# Patient Record
Sex: Female | Born: 1935 | Race: Black or African American | Hispanic: No | Smoking: Never smoker
Health system: Southern US, Community
[De-identification: ages and names within clinical notes are randomized; demographics above are authoritative.]

## PROBLEM LIST (undated history)

## (undated) DIAGNOSIS — K219 Gastro-esophageal reflux disease without esophagitis: Secondary | ICD-10-CM

## (undated) DIAGNOSIS — F0390 Unspecified dementia without behavioral disturbance: Secondary | ICD-10-CM

## (undated) DIAGNOSIS — I10 Essential (primary) hypertension: Secondary | ICD-10-CM

## (undated) DIAGNOSIS — M549 Dorsalgia, unspecified: Secondary | ICD-10-CM

## (undated) HISTORY — PX: REPLACEMENT TOTAL KNEE: SUR1224

## (undated) HISTORY — PX: BREAST CYST EXCISION: SHX579

## (undated) HISTORY — PX: ABDOMINAL HYSTERECTOMY: SHX81

---

## 2004-10-08 ENCOUNTER — Other Ambulatory Visit: Payer: Self-pay

## 2004-10-25 ENCOUNTER — Inpatient Hospital Stay: Payer: Self-pay | Admitting: Specialist

## 2005-06-18 ENCOUNTER — Ambulatory Visit: Payer: Self-pay | Admitting: Unknown Physician Specialty

## 2005-09-17 ENCOUNTER — Inpatient Hospital Stay: Payer: Self-pay | Admitting: Specialist

## 2006-02-05 ENCOUNTER — Ambulatory Visit: Payer: Self-pay | Admitting: Unknown Physician Specialty

## 2006-07-31 ENCOUNTER — Ambulatory Visit: Payer: Self-pay | Admitting: Unknown Physician Specialty

## 2006-09-12 ENCOUNTER — Ambulatory Visit: Payer: Self-pay | Admitting: Unknown Physician Specialty

## 2007-08-17 ENCOUNTER — Ambulatory Visit: Payer: Self-pay | Admitting: Unknown Physician Specialty

## 2008-05-12 IMAGING — CR DG KNEE 1-2V*R*
1 series · 2 of 2 positions shown · non-contrast
Comparison: none

REASON FOR EXAM: Post-op
COMMENTS:  Bedside (portable):Y

[Series 1: view not recorded · 0.17mm/px · 2 of 2 slices shown]
[im 1/2]
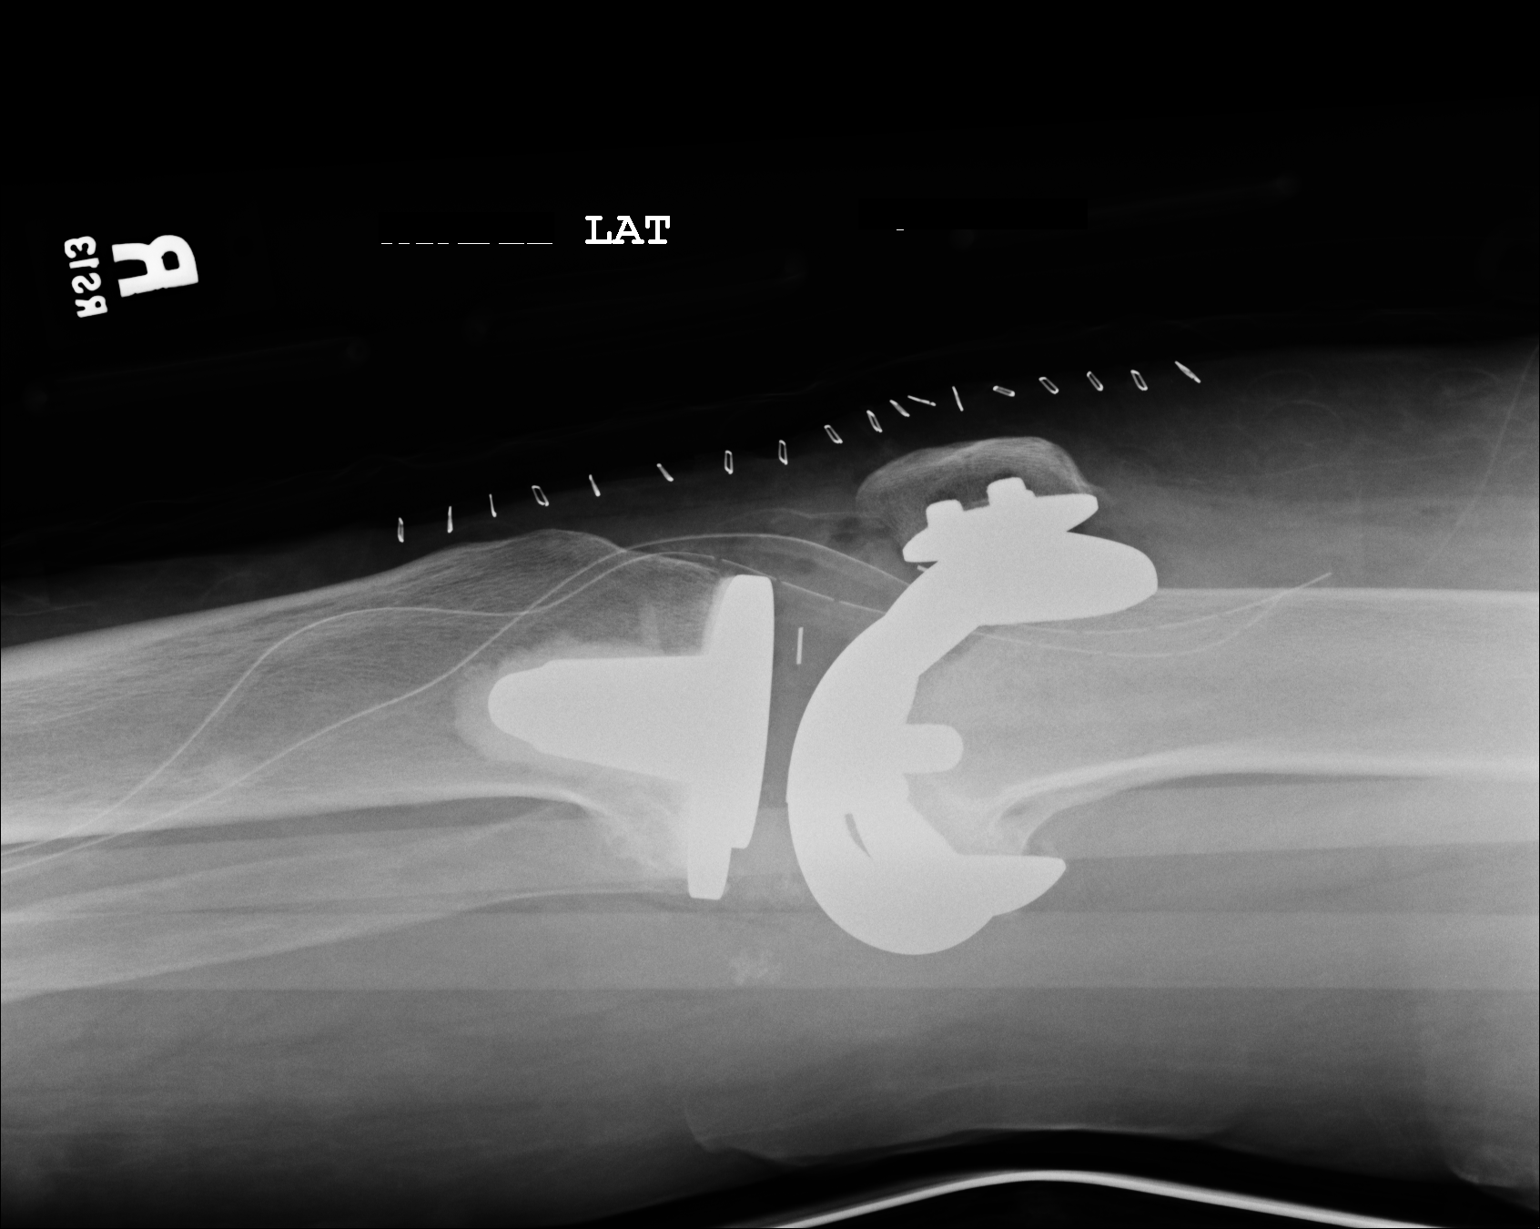
[im 2/2]
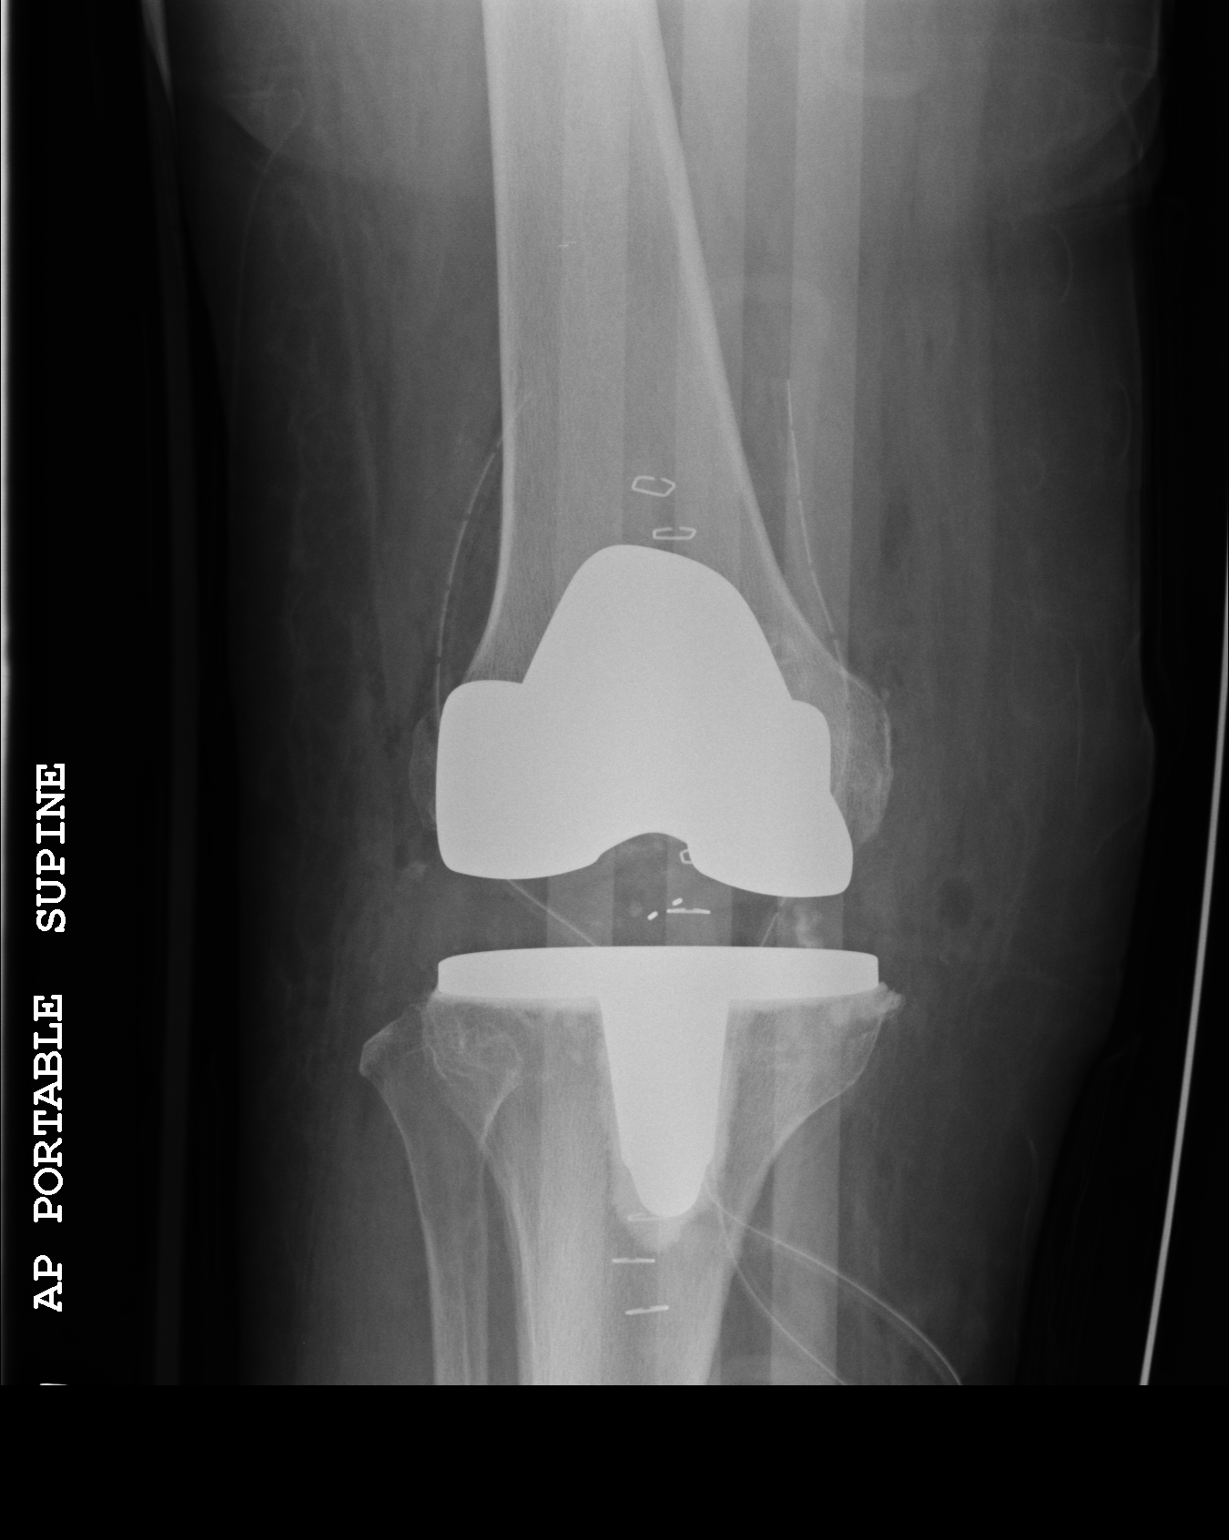

[2 of 2 positions shown; findings below may reference images not displayed]

PROCEDURE:     DXR - DXR KNEE RIGHT AP AND LATERAL  - September 17, 2005 [DATE]

RESULT:     Portable views of the RIGHT knee reveal the patient to have
undergone total knee prosthesis placement. Radiographic positioning of the
prosthetic components is good. Orthopedic drain lines and skin staples are
present.
IMPRESSION: The patient has undergone RIGHT total knee prosthesis
placement.

Further interpretation is deferred to Dr. Okletey.

## 2008-12-06 ENCOUNTER — Ambulatory Visit: Payer: Self-pay | Admitting: Unknown Physician Specialty

## 2009-12-07 ENCOUNTER — Ambulatory Visit: Payer: Self-pay | Admitting: Unknown Physician Specialty

## 2010-12-10 ENCOUNTER — Ambulatory Visit: Payer: Self-pay | Admitting: Unknown Physician Specialty

## 2011-12-18 ENCOUNTER — Ambulatory Visit: Payer: Self-pay | Admitting: Unknown Physician Specialty

## 2013-03-31 ENCOUNTER — Ambulatory Visit: Payer: Self-pay | Admitting: Internal Medicine

## 2015-01-27 ENCOUNTER — Encounter: Payer: Self-pay | Admitting: Emergency Medicine

## 2015-01-27 ENCOUNTER — Inpatient Hospital Stay
Admission: EM | Admit: 2015-01-27 | Discharge: 2015-02-04 | DRG: 064 | Disposition: E | Payer: Medicare Other | Attending: Internal Medicine | Admitting: Internal Medicine

## 2015-01-27 ENCOUNTER — Emergency Department: Payer: Medicare Other

## 2015-01-27 DIAGNOSIS — Z720 Tobacco use: Secondary | ICD-10-CM

## 2015-01-27 DIAGNOSIS — F17210 Nicotine dependence, cigarettes, uncomplicated: Secondary | ICD-10-CM

## 2015-01-27 DIAGNOSIS — R4020 Unspecified coma: Secondary | ICD-10-CM | POA: Diagnosis present

## 2015-01-27 DIAGNOSIS — I10 Essential (primary) hypertension: Secondary | ICD-10-CM | POA: Diagnosis present

## 2015-01-27 DIAGNOSIS — Z8249 Family history of ischemic heart disease and other diseases of the circulatory system: Secondary | ICD-10-CM | POA: Diagnosis not present

## 2015-01-27 DIAGNOSIS — R001 Bradycardia, unspecified: Secondary | ICD-10-CM | POA: Diagnosis present

## 2015-01-27 DIAGNOSIS — Z515 Encounter for palliative care: Secondary | ICD-10-CM

## 2015-01-27 DIAGNOSIS — I609 Nontraumatic subarachnoid hemorrhage, unspecified: Principal | ICD-10-CM | POA: Diagnosis present

## 2015-01-27 DIAGNOSIS — F0390 Unspecified dementia without behavioral disturbance: Secondary | ICD-10-CM | POA: Diagnosis present

## 2015-01-27 DIAGNOSIS — Z96653 Presence of artificial knee joint, bilateral: Secondary | ICD-10-CM | POA: Diagnosis present

## 2015-01-27 DIAGNOSIS — Z66 Do not resuscitate: Secondary | ICD-10-CM | POA: Diagnosis present

## 2015-01-27 DIAGNOSIS — M109 Gout, unspecified: Secondary | ICD-10-CM | POA: Diagnosis present

## 2015-01-27 DIAGNOSIS — I619 Nontraumatic intracerebral hemorrhage, unspecified: Secondary | ICD-10-CM | POA: Diagnosis not present

## 2015-01-27 DIAGNOSIS — G919 Hydrocephalus, unspecified: Secondary | ICD-10-CM | POA: Diagnosis present

## 2015-01-27 DIAGNOSIS — I615 Nontraumatic intracerebral hemorrhage, intraventricular: Secondary | ICD-10-CM | POA: Diagnosis present

## 2015-01-27 DIAGNOSIS — I447 Left bundle-branch block, unspecified: Secondary | ICD-10-CM | POA: Diagnosis present

## 2015-01-27 DIAGNOSIS — J9600 Acute respiratory failure, unspecified whether with hypoxia or hypercapnia: Secondary | ICD-10-CM | POA: Diagnosis present

## 2015-01-27 DIAGNOSIS — E876 Hypokalemia: Secondary | ICD-10-CM | POA: Diagnosis present

## 2015-01-27 DIAGNOSIS — G936 Cerebral edema: Secondary | ICD-10-CM | POA: Diagnosis present

## 2015-01-27 DIAGNOSIS — K219 Gastro-esophageal reflux disease without esophagitis: Secondary | ICD-10-CM

## 2015-01-27 DIAGNOSIS — I719 Aortic aneurysm of unspecified site, without rupture: Secondary | ICD-10-CM | POA: Diagnosis present

## 2015-01-27 DIAGNOSIS — R40243 Glasgow coma scale score 3-8: Secondary | ICD-10-CM | POA: Diagnosis not present

## 2015-01-27 DIAGNOSIS — I629 Nontraumatic intracranial hemorrhage, unspecified: Secondary | ICD-10-CM

## 2015-01-27 HISTORY — DX: Dorsalgia, unspecified: M54.9

## 2015-01-27 HISTORY — DX: Unspecified dementia, unspecified severity, without behavioral disturbance, psychotic disturbance, mood disturbance, and anxiety: F03.90

## 2015-01-27 HISTORY — DX: Gastro-esophageal reflux disease without esophagitis: K21.9

## 2015-01-27 HISTORY — DX: Essential (primary) hypertension: I10

## 2015-01-27 LAB — URINALYSIS COMPLETE WITH MICROSCOPIC (ARMC ONLY)
BACTERIA UA: NONE SEEN
Bilirubin Urine: NEGATIVE
Glucose, UA: 50 mg/dL — AB
HGB URINE DIPSTICK: NEGATIVE
Ketones, ur: NEGATIVE mg/dL
LEUKOCYTES UA: NEGATIVE
NITRITE: NEGATIVE
PH: 7 (ref 5.0–8.0)
PROTEIN: NEGATIVE mg/dL
SPECIFIC GRAVITY, URINE: 1.013 (ref 1.005–1.030)

## 2015-01-27 LAB — DIFFERENTIAL
BASOS ABS: 0 10*3/uL (ref 0–0.1)
BASOS PCT: 0 %
EOS ABS: 0.1 10*3/uL (ref 0–0.7)
Eosinophils Relative: 2 %
Lymphocytes Relative: 42 %
Lymphs Abs: 2.2 10*3/uL (ref 1.0–3.6)
MONOS PCT: 6 %
Monocytes Absolute: 0.3 10*3/uL (ref 0.2–0.9)
NEUTROS PCT: 50 %
Neutro Abs: 2.6 10*3/uL (ref 1.4–6.5)

## 2015-01-27 LAB — COMPREHENSIVE METABOLIC PANEL
ALT: 15 U/L (ref 14–54)
AST: 29 U/L (ref 15–41)
Albumin: 3.8 g/dL (ref 3.5–5.0)
Alkaline Phosphatase: 73 U/L (ref 38–126)
Anion gap: 8 (ref 5–15)
BUN: 14 mg/dL (ref 6–20)
CHLORIDE: 107 mmol/L (ref 101–111)
CO2: 28 mmol/L (ref 22–32)
Calcium: 9 mg/dL (ref 8.9–10.3)
Creatinine, Ser: 0.76 mg/dL (ref 0.44–1.00)
GFR calc Af Amer: >60 mL/min (ref >60)
Glucose, Bld: 204 mg/dL — ABNORMAL HIGH (ref 65–99)
POTASSIUM: 3.3 mmol/L — AB (ref 3.5–5.1)
SODIUM: 143 mmol/L (ref 135–145)
Total Bilirubin: 0.7 mg/dL (ref 0.3–1.2)
Total Protein: 6.5 g/dL (ref 6.5–8.1)

## 2015-01-27 LAB — APTT: APTT: 24 s (ref 24–36)

## 2015-01-27 LAB — CBC
HEMATOCRIT: 42.5 % (ref 35.0–47.0)
HEMOGLOBIN: 13.9 g/dL (ref 12.0–16.0)
MCH: 31.1 pg (ref 26.0–34.0)
MCHC: 32.7 g/dL (ref 32.0–36.0)
MCV: 95.2 fL (ref 80.0–100.0)
Platelets: 194 10*3/uL (ref 150–440)
RBC: 4.46 MIL/uL (ref 3.80–5.20)
RDW: 14.5 % (ref 11.5–14.5)
WBC: 5.2 10*3/uL (ref 3.6–11.0)

## 2015-01-27 LAB — PROTIME-INR
INR: 0.89
PROTHROMBIN TIME: 12.3 s (ref 11.4–15.0)

## 2015-01-27 LAB — MRSA PCR SCREENING: MRSA BY PCR: NEGATIVE

## 2015-01-27 LAB — TROPONIN I

## 2015-01-27 LAB — LACTIC ACID, PLASMA: LACTIC ACID, VENOUS: 2.2 mmol/L — AB (ref 0.5–2.0)

## 2015-01-27 LAB — ETHANOL: Alcohol, Ethyl (B): <5 mg/dL (ref <5)

## 2015-01-27 MED ORDER — GLYCOPYRROLATE 0.2 MG/ML IJ SOLN: 0.4000 mg | Freq: Four times a day (QID) | INTRAMUSCULAR | Status: DC | PRN

## 2015-01-27 MED ORDER — BISACODYL 10 MG RE SUPP: 10.0000 mg | Freq: Every day | RECTAL | Status: DC | PRN

## 2015-01-27 MED ORDER — PROPOFOL 1000 MG/100ML IV EMUL
INTRAVENOUS | Status: AC
  Administered 2015-01-27: 1000 mg
  Filled 2015-01-27: qty 100

## 2015-01-27 MED ORDER — ONDANSETRON HCL 4 MG/2ML IJ SOLN: 4.0000 mg | Freq: Four times a day (QID) | INTRAMUSCULAR | Status: DC | PRN

## 2015-01-27 MED ORDER — DEXAMETHASONE SODIUM PHOSPHATE 10 MG/ML IJ SOLN
10.0000 mg | Freq: Four times a day (QID) | INTRAMUSCULAR | Status: DC
  Administered 2015-01-27 – 2015-01-28 (×5): 10 mg via INTRAVENOUS
  Filled 2015-01-27 (×13): qty 1

## 2015-01-27 MED ORDER — HYDRALAZINE HCL 20 MG/ML IJ SOLN
10.0000 mg | Freq: Once | INTRAMUSCULAR | Status: AC
  Administered 2015-01-27: 10 mg via INTRAVENOUS
  Filled 2015-01-27: qty 1

## 2015-01-27 MED ORDER — NALOXONE HCL 1 MG/ML IJ SOLN
INTRAMUSCULAR | Status: AC
  Administered 2015-01-27: 2 mg
  Filled 2015-01-27: qty 2

## 2015-01-27 MED ORDER — SODIUM CHLORIDE 0.9 % IJ SOLN
3.0000 mL | Freq: Two times a day (BID) | INTRAMUSCULAR | Status: DC
  Administered 2015-01-28 – 2015-01-30 (×4): 3 mL via INTRAVENOUS

## 2015-01-27 MED ORDER — PROCHLORPERAZINE 25 MG RE SUPP
25.0000 mg | Freq: Two times a day (BID) | RECTAL | Status: DC | PRN
  Filled 2015-01-27: qty 1

## 2015-01-27 MED ORDER — MORPHINE SULFATE (PF) 2 MG/ML IV SOLN
2.0000 mg | Freq: Once | INTRAVENOUS | Status: AC
  Administered 2015-01-27: 2 mg via INTRAVENOUS
  Filled 2015-01-27: qty 1

## 2015-01-27 MED ORDER — ACETAMINOPHEN 650 MG RE SUPP: 650.0000 mg | Freq: Four times a day (QID) | RECTAL | Status: DC | PRN

## 2015-01-27 MED ORDER — HYDRALAZINE HCL 20 MG/ML IJ SOLN: 5.0000 mg | INTRAMUSCULAR | Status: DC | PRN

## 2015-01-27 MED ORDER — LORAZEPAM 2 MG/ML IJ SOLN: 0.5000 mg | INTRAMUSCULAR | Status: DC | PRN

## 2015-01-27 MED ORDER — PANTOPRAZOLE SODIUM 40 MG PO PACK: 40.0000 mg | PACK | Freq: Every day | ORAL | Status: DC

## 2015-01-27 MED ORDER — MORPHINE SULFATE (PF) 2 MG/ML IV SOLN: 2.0000 mg | INTRAVENOUS | Status: DC | PRN

## 2015-01-27 NOTE — H&P (Signed)
[facility] [place] [facility]   PATIENT NAME: Abigail Rasmussen    MR#:  [id number]  DATE OF BIRTH:  05/02/1916  DATE OF ADMISSION:  01/22/2015  PRIMARY CARE PHYSICIAN: Leotis Shames  REQUESTING/REFERRING PHYSICIAN: Minna Antis  CHIEF COMPLAINT:   Chief Complaint  Patient presents with  . Code Stroke    HISTORY OF PRESENT ILLNESS:  Evella Kasal  is a 81 y.o. female with a known history of gastroesophageal reflux disease, hypertension, disc bulge. As per family, the patient got dressed this morning, took a shower and went outside for a while and came back in. The patient was hollering and family came in. The patient complained that her head and I was hurting. She slumped over. When EMS arrived, they thought she was going to vomit but she didn't. They couldn't get her to respond. Patient was brought in for further evaluation. Patient was intubated and is unresponsive. Patient was found to have a massive head bleed. Family declined transfer to a center with a neurosurgeon. They made the patient a DO NOT RESUSCITATE.  PAST MEDICAL HISTORY:   Past Medical History  Diagnosis Date  . Hypertension   . Dementia   . GERD (gastroesophageal reflux disease)   . Back pain     PAST SURGICAL HISTORY:   Past Surgical History  Procedure Laterality Date  . Abdominal hysterectomy    . Breast cyst excision    . Replacement total knee Bilateral     SOCIAL HISTORY:   Social History  Substance Use Topics  . Smoking status: Never Smoker   . Smokeless tobacco: Current User  . Alcohol Use: No    FAMILY HISTORY:   Family History  Problem Relation Age of Onset  . CAD Mother     DRUG ALLERGIES:   Allergies  Allergen Reactions  . Penicillins     REVIEW OF SYSTEMS:  Unable to obtain secondary to altered mental status   MEDICATIONS AT HOME:      prescription writer not updated.  Patient takes aspirin 325 mg by mouth daily, Carafate 1 g 4 times  a day, metoprolol ER 50 mg daily, tramadol 50 mg when necessary omeprazole 20 mg twice a day, Lasix 20 mg by mouth daily, allopurinol 100 mg by mouth daily Namenda 5 mg.  VITAL SIGNS:  Blood pressure 186/88, pulse 59, temperature 96.5 F (35.8 C), temperature source Rectal, resp. rate 21, weight 77.111 kg (170 lb), SpO2 100 %.  PHYSICAL EXAMINATION:  GENERAL:  81 y.o.-year-old patient lying in the bed, unresponsive on ventilator.Marland Kitchen  EYES: Pupils  Pinpoint. Unable to test extraocular muscles. HEENT:  Unable to look in mouth. Nasopharynx no erythema NECK:  Supple, no jugular venous distention. No thyroid enlargement, no tenderness.  LUNGS: decreased  breath sounds bilaterally, slight expiratory  wheezing,  norales,rhonchi or crepitation. No use of accessory muscles of respiration.  CARDIOVASCULAR: S1, S2 normal.  2/6 systolicmurmur,  norubs, or gallops.  ABDOMEN: Soft, nontender, nondistended. Bowel sounds present. No organomegaly or mass.  EXTREMITIES: No pedal edema, cyanosis, or clubbing.  NEUROLOGIC:  Patient unresponsive to sternal rub PSYCHIATRIC:  Unable to test secondary to altered mental status SKIN: No rash, lesion, or ulcer.   LABORATORY PANEL:   CBC  Recent Labs Lab 01/28/2015 0943  WBC 5.2  HGB 13.9  HCT 42.5  PLT 194   ------------------------------------------------------------------------------------------------------------------  Chemistries   Recent Labs Lab 01/07/2015 0943  NA 143  K 3.3*  CL 107  CO2 28  GLUCOSE 204*  BUN 14  CREATININE 0.76  CALCIUM 9.0  AST 29  ALT 15  ALKPHOS 73  BILITOT 0.7   ------------------------------------------------------------------------------------------------------------------  Cardiac Enzymes  Recent Labs Lab 01/11/2015 0943  TROPONINI <0.03   ------------------------------------------------------------------------------------------------------------------  RADIOLOGY:  Ct Head Wo Contrast  01/08/2015    CLINICAL DATA:  Pt with normal morning per nurse and then went unresponsive at 8:55 am.  EXAM: CT HEAD WITHOUT CONTRAST  TECHNIQUE: Contiguous axial images were obtained from the base of the skull through the vertex without intravenous contrast.  COMPARISON:  Brain MRI, 03/31/2013.  FINDINGS: There is extensive acute intracranial hemorrhage. Subarachnoid hemorrhage surrounds the brainstem from the pons through the midbrain, filling the basilar cisterns. It extends into the suprasellar cistern and throughout the left sylvian fissure with lesser subarachnoid hemorrhage noted along the interhemispheric fissure. Hemorrhage extends into the sulci of the left temporal, posterior frontal and parietal lobes. There is also prominent intraventricular hemorrhage with hemorrhage mostly filling the left lateral ventricle, partly filling the right lateral ventricle, filling the third ventricle and mostly filling the fourth ventricle. There is a small amount of subdural hemorrhage along the anterior interhemispheric fissure and the tentorium.  The ventricles are enlarged, to a greater degree than they were on the prior brain MRI, and there is relative sulcal effacement. These findings are consistent with hydrocephalus. There is mild midline shift to the right of 4 mm.  There is hypoattenuation throughout the white matter of the left cerebral hemisphere with milder rope right-sided white matter hypoattenuation. Findings are consistent with a combination of vasogenic edema and underlying chronic microvascular ischemic change. No convincing cortical infarct.  The visualized sinuses and mastoid air cells are clear. No skull lesion.  IMPRESSION: 1. Extensive intracranial hemorrhage which is predominantly subarachnoid and intraventricular hemorrhage. 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 2. There is ventricular  enlargement consistent with hydrocephalus. 3. There is vasogenic edema, more on the left than the right. Mild midline shift to the right noted of 4 mm. 4. No evidence of an ischemic infarct. These results were called by telephone at the time of interpretation on 01/12/2015 at 10:19 am to Dr. Minna Antis , who verbally acknowledged these results.   Electronically Signed   By: Amie Portland M.D.   On: 01/28/2015 10:20     IMPRESSION AND PLAN:   1. Extensive intracranial hemorrhage predominantly subarachnoid. Patient also has vasogenic edema. Family did not want to transfer to a tertiary care center where there is a Midwife. Patient was made a DO NOT RESUSCITATE. Family will be coming in from out of town. Further decision making process can be delayed then. I will start Decadron for the edema. When necessary IV held rales in for accelerated hypertension. Overall prognosis is poor. I don't expect the patient to survive much longer. I will get a palliative care consultation. Patient intubated for acute respiratory failure and airway protection. 2. Accelerated hypertension- when necessary IV hydralazine 3. Gastroesophageal reflux disease without esophagitis 4. History of gout 5. History of dementia  All the records are reviewed and case discussed with ED provider. Management plans discussed with the family and they are in agreement.  CODE STATUS: DO NOT RESUSCITATE  TOTAL TIME TAKING CARE OF THIS PATIENT: .  Patient will be admitted to the CCU secondary to being on the ventilator.   Alford Highland M.D on 01/06/2015 at  11:46 AM  Between 7am to 6pm - Pager - [phone]  After 6pm call admission pager [phone]  [facility]  Office  [phone]  CC: Primary care physician; No primary care provider on file.

## 2015-01-27 NOTE — Progress Notes (Signed)
   01/18/2015 1358  Clinical Encounter Type  Visited With Family  Visit Type Initial  Referral From Chaplain  Consult/Referral To Chaplain  Spiritual Encounters  Spiritual Needs Emotional  Stress Factors  Family Stress Factors Major life changes  Chaplain received referral from on-call chaplain who had visited with the family in the ED. Introduced self and offered a compassionate presence, chairs and kleenex to the family as they visited with patient in room and in the waiting room. Chaplain Omri Bertran A. Yassmin Binegar Ext. [phone]

## 2015-01-27 NOTE — Progress Notes (Signed)
   01/21/2015 2200  Clinical Encounter Type  Visited With Family  Visit Type Follow-up;Spiritual support  Spiritual Encounters  Spiritual Needs Emotional;Grief support  Continuing to follow family through grief process with patient who is dying. Offered emotional and spiritual support.

## 2015-01-27 NOTE — Progress Notes (Signed)
eLink Physician-Brief Progress Note Patient Name: Abigail Rasmussen DOB: 1915/06/07 MRN: [id number]   Date of Service  2015-02-22  HPI/Events of Note  ICH, intubated  eICU Interventions  protonix for SUP     Intervention Category Intermediate Interventions: Best-practice therapies (e.g. DVT, beta blocker, etc.)  ALVA,RAKESH V. February 22, 2015, 8:10 PM

## 2015-01-27 NOTE — ED Provider Notes (Signed)
[facility] Emergency Department Provider Note  Time seen: 9:52 AM  I have reviewed the triage vital signs and the nursing notes.   HISTORY  Chief Complaint No chief complaint on file.    HPI Abigail Rasmussen is a 81 y.o. female with a currently unknown past medical history presents to the emergency department unresponsive. According to family, per EMS report, the patient was awake, had showered, was getting ready for the day when she acutely became unresponsive. Patient unresponsive to painful or verbal stimuli, EMS was called. EMS stated very shallow breathing, unresponsive on arrival. They gave 2 mg of Narcan IV and did notice some response as far as her respiratory rate improving. However they noted the patient remained unresponsive to painful and verbal stimuli. Patient is brought to the emergency department emergency tablet, upon arrival to the emergency department the patient remains unresponsive to painful and verbal stimuli. No family present currently for additional history. EMS did confirm with the family that the patient does not have any advanced directives.    No past medical history on file.  There are no active problems to display for this patient.   No past surgical history on file.  No current outpatient prescriptions on file.  Allergies Review of patient's allergies indicates not on file.  No family history on file.  Social History Social History  Substance Use Topics  . Smoking status: Not on file  . Smokeless tobacco: Not on file  . Alcohol Use: Not on file    Review of Systems Unable to obtain a review of systems is a patient is unresponsive.  ____________________________________________   PHYSICAL EXAM:  VITAL SIGNS: ED Triage Vitals  Enc Vitals Group     BP 02-04-2015 0945 155/71 mmHg     Pulse Rate 2015/02/04 0945 61     Resp 2015-02-04 0945 26     Temp --      Temp src --      SpO2 02-04-2015 0945 100 %     Weight  2015-02-04 0945 170 lb (77.111 kg)     Height --      Head Cir --      Peak Flow --      Pain Score --      Pain Loc --      Pain Edu? --      Excl. in GC? --     Constitutional: Unresponsive to painful and verbal stimuli. Sonorous breathing. Eyes: 1 mm equal bilaterally ENT   Head: Normocephalic and atraumatic. Cardiovascular: Normal rate, regular rhythm Respiratory: Sonorous breathing. No wheezes/rales/rhonchi. Breath sounds are equal bilaterally. Gastrointestinal: Soft abdomen. No distention. No reaction to palpation (unresponsive) Musculoskeletal: Atraumatic extremities. Neurologic:  Unresponsive to verbal and painful stimuli. No response to sternal rub. Skin:  Skin is warm, dry and intact.  Psychiatric: Unresponsive   ____________________________________________    EKG  EKG reviewed and interpreted by myself shows sinus bradycardia at 56 bpm, widened QRS, left axis deviation, most consistent with left bundle branch block.  ____________________________________________    RADIOLOGY  CT scan consistent with intracranial hemorrhage  ____________________________________________ PROCEDURES  Procedure(s) performed: Intubation, see procedure note(s).  Critical Care performed: Yes, see critical care note(s)    INTUBATION Performed by: Minna Antis  Required items: required blood products, implants, devices, and special equipment available Patient identity confirmed: provided demographic data and hospital-assigned identification number Time out: Immediately prior to procedure a "time out" was called to verify the correct patient, procedure, equipment,  support staff and site/side marked as required.  Indications: Unresponsiveness   Intubation method: 4.0 Glidescope Laryngoscopy   Preoxygenation: 100% oxygen BVM  Sedatives: 20 Etomidate Paralytic: 100 Succinylcholine  Tube Size: 7.0 cuffed  Post-procedure assessment: chest rise and ETCO2 monitor Breath  sounds: equal and absent over the epigastrium Tube secured with: ETT holder    Patient tolerated the procedure well with no immediate complications.   CRITICAL CARE Performed by: Minna Antis   Total critical care time: 60 minutes  Critical care time was exclusive of separately billable procedures and treating other patients.  Critical care was necessary to treat or prevent imminent or life-threatening deterioration.  Critical care was time spent personally by me on the following activities: development of treatment plan with patient and/or surrogate as well as nursing, discussions with consultants, evaluation of patient's response to treatment, examination of patient, obtaining history from patient or surrogate, ordering and performing treatments and interventions, ordering and review of laboratory studies, ordering and review of radiographic studies, pulse oximetry and re-evaluation of patient's condition.   ____________________________________________    INITIAL IMPRESSION / ASSESSMENT AND PLAN / ED COURSE  Pertinent labs & imaging results that were available during my care of the patient were reviewed by me and considered in my medical decision making (see chart for details).  Patient presents the emergency department unresponsive. Per EMS report the patient was alert, showered herself this morning, but around 8:30 AM became acutely unresponsive. 2 mg of Narcan given by EMS and they state some improvement in respiratory rate. 2 mg of Narcan given in the emergency department with no response. Patient remains unresponsive to painful and verbal stimuli. No response to painful sternal rub. Decision was made to intubate for airway protection. Patient intubated using etomidate and succinylcholine, first attempt, no complications. Patient placed on propofol sedation. Code stroke called as the patient had an acute change in mental status/unresponsiveness beginning at 8:30 AM (clear  onset). Patient will be taken emergently to CT scan. Currently awaiting family arrival to discuss further care/management.  CT scan is unfortunately consistent with massive intracranial hemorrhage. I discussed the findings with the family. The family is very understandable and reasonable. Given the patient's age and comorbidities as well as degree of hemorrhage they do not wish to proceed with transfer for further evaluation. He would like the patient to remain intubated until family could arrive, we will keep the patient on propofol, and pain medication as needed. They are agreeable to comfort care measures. Patient will be admitted to [facility] for comfort care measures. Patient's blood pressure does continue to rise is now 200 systolic, with a heart rate in the 40s, most consistent with Cushing's reaction. We will treat with hydralazine. Patient to be admitted to the intensive care unit under comfort care measures.  ____________________________________________   FINAL CLINICAL IMPRESSION(S) / ED DIAGNOSES  Unresponsiveness Intracranial hemorrhage  Minna Antis, MD 31-Jan-2015 1114

## 2015-01-27 NOTE — Consult Note (Signed)
Palliative Medicine Inpatient Consult Note   Name: Abigail Rasmussen Date: 01/16/2015 MRN: [id number]  DOB: [birth year]  Referring Physician: Alford Highland, MD  Palliative Care consult requested for this 81 y.o. female for goals of medical therapy in patient with an acute severe hemorrhagic stroke felt to be something pt cannot survive.    IMPRESSION: 1.  Acute extensive intracranial hemorrhage ---likely due to ruptured aneurysm ---not survivable ---not to be transferred out due to grim prognosis even with neurosurgical intervention 2. Descending aortic aneurysm 3. Essential HTN 4. Degenerative Disc Disease with back pain 5. GERD 6.  Dementia --she still was independent with ALL ADLS and cooked full meals at home!!  7.  Smokeless tobacco use 8.  Intubated and ventilated status due to protection of airway needs given catastrophic stroke 9.  Hypokalemia   TODAY'S DISCUSSIONS AND DECISIONS: 1.   Pt is DNR   2.   Pt has one daughter here and other family members. They seem to be accepting that pt is actively dying. Pts daughter, however, is looking for 'good signs' like breathing over the vent and visibly swallowing saliva.  She is saying she just wants her to 'be with her here a while longer'.    3.  Patient's other daughter is arriving on a plane from [place] tonight and should be here around 9:30 pm.  Either then (if I am still here) or tomorrow, we can talk about plans for extubation to full comfort / terminal care.    4.  I have eliminated some non-comfort and futile orders so that pt will get something closer to comfort care.  I mentioned to family that her medications and care should be 'mostly comfort' and they were in agreement.      REVIEW OF SYSTEMS:  Patient is not able to provide ROS due to being intubated and sedated  SPIRITUAL SUPPORT SYSTEM: Yes.  SOCIAL HISTORY:  reports that she has never smoked. She uses smokeless tobacco. She reports that she does not drink  alcohol or use illicit drugs. Lived at home with family.  Until this event, she walked completely independently --dressing herself as she has done every day etc. This am, she cooked a big pot of butter beans and made cornbread in the oven. She then made up her bed. Then she sat down and had this stroke.    LEGAL DOCUMENTS:  I have placed a [facility] form in the paper chart.  CODE STATUS: DNR  PAST MEDICAL HISTORY: Past Medical History  Diagnosis Date  . Hypertension   . Dementia   . GERD (gastroesophageal reflux disease)   . Back pain     PAST SURGICAL HISTORY:  Past Surgical History  Procedure Laterality Date  . Abdominal hysterectomy    . Breast cyst excision    . Replacement total knee Bilateral     ALLERGIES:  is allergic to penicillins.  MEDICATIONS:  Current Facility-Administered Medications  Medication Dose Route Frequency Provider Last Rate Last Dose  . dexamethasone (DECADRON) injection 10 mg  10 mg Intravenous 4 times per day Alford Highland, MD   10 mg at 01/14/2015 1206  . hydrALAZINE (APRESOLINE) injection 5 mg  5 mg Intravenous Q4H PRN Alford Highland, MD      . morphine 2 MG/ML injection 2 mg  2 mg Intravenous Q1H PRN Alford Highland, MD      . sodium chloride 0.9 % injection 3 mL  3 mL Intravenous Q12H Alford Highland, MD  Vital Signs: BP 150/70 mmHg  Pulse 59  Temp(Src) 96.3 F (35.7 C) (Axillary)  Resp 21  Wt 77.111 kg (170 lb)  SpO2 100% Filed Weights   2015/01/28 0945  Weight: 77.111 kg (170 lb)    There is no height on file to calculate BMI.  PERFORMANCE STATUS (ECOG) : 4 - Bedbound    PHYSICAL EXAM: Intubated and unresponsive Breathing over the vent a little bit for a total of 21 bpm No JVD or Tm Hrt rrr no mgr but skipped beats are heard Lungs vent sounds but no rales Abd soft and NT with nl BS Ext no cyanosis or mottling noted.    LABS: CBC:    Component Value Date/Time   WBC 5.2 01-28-2015 0943   HGB 13.9 2015-01-28 0943    HCT 42.5 01/28/2015 0943   PLT 194 28-Jan-2015 0943   MCV 95.2 01/28/15 0943   NEUTROABS 2.6 January 28, 2015 0943   LYMPHSABS 2.2 Jan 28, 2015 0943   MONOABS 0.3 01/28/2015 0943   EOSABS 0.1 01-28-15 0943   BASOSABS 0.0 January 28, 2015 0943   Comprehensive Metabolic Panel:    Component Value Date/Time   NA 143 01-28-15 0943   K 3.3* 01-28-2015 0943   CL 107 01/28/2015 0943   CO2 28 01-28-15 0943   BUN 14 2015-01-28 0943   CREATININE 0.76 01-28-15 0943   GLUCOSE 204* 01/28/15 0943   CALCIUM 9.0 01/28/2015 0943   AST 29 January 28, 2015 0943   ALT 15 01/28/2015 0943   ALKPHOS 73 01/28/2015 0943   BILITOT 0.7 01/28/15 0943   PROT 6.5 01/28/15 0943   ALBUMIN 3.8 Jan 28, 2015 0943   TESTS: CT head wo CM January 28, 2015: 1. Extensive intracranial hemorrhage which is predominantly subarachnoid and intraventricular hemorrhage. 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 2. There is ventricular enlargement consistent with hydrocephalus. 3. There is vasogenic edema, more on the left than the right. Mild midline shift to the right noted of 4 mm. 4. No evidence of an ischemic infarct. These results were called by telephone at the time of interpretation on Jan 28, 2015 at 10:19 am to Dr. Minna Antis , who verbally acknowledged these results.   CXR Jan 28, 2015: Endotracheal tube in grossly good position. Tortuosity and enlargement of descending thoracic aorta is noted concerning for possible thoracic aortic aneurysm. CTA of the chest is recommended for further evaluation.  --------------------------------------------------------------------------------------------------------------  More than 50% of the visit was spent in counseling/coordination of care: Yes  Time Spent:80  minutes

## 2015-01-27 NOTE — ED Notes (Signed)
Pt to ed via ems emergency traffic from home. Daughter reports pt was sitting in her chair and yelled out c/o back pain. Daughter states when she went to check on pt she was slumped over in the chair unresponsive. Ems reports the same on arrival to home. Ems gave  narcan with no response. Pt arrives to ed unresponsive with snoring respirations along with pin point pupils.

## 2015-01-27 NOTE — Progress Notes (Signed)
   2015-02-13 0945  Clinical Encounter Type  Visited With Family  Visit Type Initial  Referral From Nurse  Consult/Referral To Chaplain  Spiritual Encounters  Spiritual Needs Emotional;Grief support  Stress Factors  Family Stress Factors Major life changes  Provided emotional and spiritual support for family members when they initially arrived and upon news that patient might not make it.

## 2015-01-27 NOTE — ED Notes (Signed)
Pt family at bedside

## 2015-01-28 ENCOUNTER — Inpatient Hospital Stay: Payer: Medicare Other

## 2015-01-28 DIAGNOSIS — I619 Nontraumatic intracerebral hemorrhage, unspecified: Secondary | ICD-10-CM

## 2015-01-28 DIAGNOSIS — J9600 Acute respiratory failure, unspecified whether with hypoxia or hypercapnia: Secondary | ICD-10-CM

## 2015-01-28 MED ORDER — MORPHINE SULFATE (PF) 4 MG/ML IV SOLN
4.0000 mg | Freq: Once | INTRAVENOUS | Status: AC
Start: 2015-01-28 — End: 2015-01-28
  Administered 2015-01-28: 4 mg via INTRAVENOUS
  Filled 2015-01-28: qty 1

## 2015-01-28 MED ORDER — PROPOFOL 1000 MG/100ML IV EMUL: 5.0000 ug/kg/min | INTRAVENOUS | Status: DC

## 2015-01-28 MED ORDER — NITROGLYCERIN 2 % TD OINT
1.0000 [in_us] | TOPICAL_OINTMENT | Freq: Four times a day (QID) | TRANSDERMAL | Status: DC
  Administered 2015-01-28 – 2015-01-29 (×2): 1 [in_us] via TOPICAL
  Filled 2015-01-28 (×2): qty 1

## 2015-01-28 MED ORDER — ANTISEPTIC ORAL RINSE SOLUTION (CORINZ)
7.0000 mL | OROMUCOSAL | Status: DC
  Administered 2015-01-28 – 2015-01-29 (×6): 7 mL via OROMUCOSAL
  Filled 2015-01-28 (×17): qty 7

## 2015-01-28 MED ORDER — MORPHINE 100MG IN NS 100ML (1MG/ML) PREMIX INFUSION
2.0000 mg/h | INTRAVENOUS | Status: DC
  Administered 2015-01-28 – 2015-01-30 (×2): 2 mg/h via INTRAVENOUS
  Filled 2015-01-28 (×2): qty 100

## 2015-01-28 MED ORDER — CHLORHEXIDINE GLUCONATE 0.12% ORAL RINSE (MEDLINE KIT)
15.0000 mL | Freq: Two times a day (BID) | OROMUCOSAL | Status: DC
  Administered 2015-01-28 – 2015-01-29 (×2): 15 mL via OROMUCOSAL
  Filled 2015-01-28 (×4): qty 15

## 2015-01-28 NOTE — Progress Notes (Signed)
[facility] - [employer] at [facility]                                                                                                                                                                                            Patient Demographics   Abigail Rasmussen, is a 81 y.o. female, DOB - 1915/06/21, ZOX:[id number]  Admit date - 01/14/2015   Admitting Physician Alford Highland, MD  Outpatient Primary MD for the patient is No primary care provider on file.   LOS - 1  Subjective: Patient continues to be on the ventilator on a low-dose propofol, her daughter at bedside that arrived from out of town    Review of Systems:  Unable to provide on the ventilator   Vitals:   Filed Vitals:   01/28/15 0800 01/28/15 0900 01/28/15 1000 01/28/15 1100  BP: 159/95 170/102 162/99 151/101  Pulse: 94 104 96 103  Temp:    99.6 F (37.6 C)  TempSrc:    Oral  Resp: Weight:      SpO2: 100% 100% 100% 100%    Wt Readings from Last 3 Encounters:  01/20/2015 77.111 kg (170 lb)     Intake/Output Summary (Last 24 hours) at 01/28/15 1146 Last data filed at 01/28/15 1100  Gross per 24 hour  Intake  45.35 ml  Output   1325 ml  Net -1279.65 ml    Physical Exam:   GENERAL: Critically ill-appearing.  HEAD, EYES, EARS, NOSE AND THROAT: Atraumatic, normocephalic. Pupils equal and reactive to light. Sclerae anicteric. No conjunctival injection. No oro-pharyngeal erythema.  NECK: Supple. There is no jugular venous distention. No bruits, no lymphadenopathy, no thyromegaly.  HEART: Regular rate and rhythm,. No murmurs, no rubs, no clicks.  LUNGS: Clear to auscultation bilaterally. No rales or rhonchi. No wheezes. On the ventilator ABDOMEN: Soft, flat, nontender, nondistended. Has good bowel sounds. No hepatosplenomegaly appreciated.  EXTREMITIES: No evidence of any cyanosis, clubbing, or peripheral edema.  +2 pedal and radial pulses bilaterally.  NEUROLOGIC: On low-dose  propofol SKIN: Moist and warm with no rashes appreciated.  Psych: Sedated LN: No inguinal LN enlargement    Antibiotics   Anti-infectives    None      Medications   Scheduled Meds: . dexamethasone  10 mg Intravenous 4 times per day  . pantoprazole sodium  40 mg Per Tube Q1200  . sodium chloride  3 mL Intravenous Q12H   Continuous Infusions: . propofol (DIPRIVAN) infusion Stopped (01/28/15 0951)   PRN Meds:.acetaminophen, bisacodyl, glycopyrrolate, hydrALAZINE, LORazepam, morphine injection, ondansetron (ZOFRAN) IV, prochlorperazine   Data Review:  Micro Results Recent Results (from the past 240 hour(s))  MRSA PCR Screening     Status: None   Collection Time: 01/07/2015  1:09 PM  Result Value Ref Range Status   MRSA by PCR NEGATIVE NEGATIVE Final    Comment:        The GeneXpert MRSA Assay (FDA approved for NASAL specimens only), is one component of a comprehensive MRSA colonization surveillance program. It is not intended to diagnose MRSA infection nor to guide or monitor treatment for MRSA infections.     Radiology Reports Ct Head Wo Contrast  01/19/2015   CLINICAL DATA:  Pt with normal morning per nurse and then went unresponsive at 8:55 am.  EXAM: CT HEAD WITHOUT CONTRAST  TECHNIQUE: Contiguous axial images were obtained from the base of the skull through the vertex without intravenous contrast.  COMPARISON:  Brain MRI, 03/31/2013.  FINDINGS: There is extensive acute intracranial hemorrhage. Subarachnoid hemorrhage surrounds the brainstem from the pons through the midbrain, filling the basilar cisterns. It extends into the suprasellar cistern and throughout the left sylvian fissure with lesser subarachnoid hemorrhage noted along the interhemispheric fissure. Hemorrhage extends into the sulci of the left temporal, posterior frontal and parietal lobes. There is also prominent intraventricular hemorrhage with hemorrhage mostly filling the left lateral ventricle, partly  filling the right lateral ventricle, filling the third ventricle and mostly filling the fourth ventricle. There is a small amount of subdural hemorrhage along the anterior interhemispheric fissure and the tentorium.  The ventricles are enlarged, to a greater degree than they were on the prior brain MRI, and there is relative sulcal effacement. These findings are consistent with hydrocephalus. There is mild midline shift to the right of 4 mm.  There is hypoattenuation throughout the white matter of the left cerebral hemisphere with milder rope right-sided white matter hypoattenuation. Findings are consistent with a combination of vasogenic edema and underlying chronic microvascular ischemic change. No convincing cortical infarct.  The visualized sinuses and mastoid air cells are clear. No skull lesion.  IMPRESSION: 1. Extensive intracranial hemorrhage which is predominantly subarachnoid and intraventricular hemorrhage. 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 2. There is ventricular enlargement consistent with hydrocephalus. 3. There is vasogenic edema, more on the left than the right. Mild midline shift to the right noted of 4 mm. 4. No evidence of an ischemic infarct. These results were called by telephone at the time of interpretation on 01/16/2015 at 10:19 am to Dr. Minna Antis , who verbally acknowledged these results.   Electronically Signed   By: Amie Portland M.D.   On: 01/23/2015 10:20   Dg Chest Portable 1 View  01/11/2015   CLINICAL DATA:  Endotracheal tube placement.  EXAM: PORTABLE CHEST 1 VIEW  COMPARISON:  None.  FINDINGS: Endotracheal tube is seen projected over the tracheal air shadow with distal tip approximately 5 cm above the carina. No pneumothorax or pleural effusion is noted. There is noted tortuosity and enlargement of the descending thoracic aorta concerning for  possible thoracic aortic aneurysm. No acute pulmonary disease is noted. Bony thorax is intact.  IMPRESSION: Endotracheal tube in grossly good position. Tortuosity and enlargement of descending thoracic aorta is noted concerning for possible thoracic aortic aneurysm. CTA of the chest is recommended for further evaluation.   Electronically Signed   By: Lupita Raider, M.D.   On: 01/29/2015 12:20  CBC  Recent Labs Lab 01/28/2015 0943  WBC 5.2  HGB 13.9  HCT 42.5  PLT 194  MCV 95.2  MCH 31.1  MCHC 32.7  RDW 14.5  LYMPHSABS 2.2  MONOABS 0.3  EOSABS 0.1  BASOSABS 0.0    Chemistries   Recent Labs Lab 01/05/2015 0943  NA 143  K 3.3*  CL 107  CO2 28  GLUCOSE 204*  BUN 14  CREATININE 0.76  CALCIUM 9.0  AST 29  ALT 15  ALKPHOS 73  BILITOT 0.7   ------------------------------------------------------------------------------------------------------------------ CrCl cannot be calculated (Unknown ideal weight.). ------------------------------------------------------------------------------------------------------------------ No results for input(s): HGBA1C in the last 72 hours. ------------------------------------------------------------------------------------------------------------------ No results for input(s): CHOL, HDL, LDLCALC, TRIG, CHOLHDL, LDLDIRECT in the last 72 hours. ------------------------------------------------------------------------------------------------------------------ No results for input(s): TSH, T4TOTAL, T3FREE, THYROIDAB in the last 72 hours.  Invalid input(s): FREET3 ------------------------------------------------------------------------------------------------------------------ No results for input(s): VITAMINB12, FOLATE, FERRITIN, TIBC, IRON, RETICCTPCT in the last 72 hours.  Coagulation profile  Recent Labs Lab 01/13/2015 0943  INR 0.89    No results for input(s): DDIMER in the last 72 hours.  Cardiac Enzymes  Recent Labs Lab  01/07/2015 0943  TROPONINI <0.03   ------------------------------------------------------------------------------------------------------------------ Invalid input(s): POCBNP    Assessment & Plan   1. Extensive intracranial hemorrhage predominantly subarachnoid. Patient also has vasogenic edema. Family did not want to transfer to a tertiary care center where there is a Midwife. Daughters the arrived from out of state town questioning whether any intervention was done. They also request a repeat CT scan to look for progression. At this point we'll continue supportive care prognosis is very poor. We can try to hold her sedation to assess her responsiveness.  2. Accelerated hypertension-  placed on a Nitropatch IV hydralazine 3. Gastroesophageal reflux disease without esophagitis 4. History of gout 5. History of dementia     Code Status Orders        Start     Ordered   01/23/2015 1143  Do not attempt resuscitation (DNR)   Continuous    Question Answer Comment  In the event of cardiac or respiratory ARREST Do not call a "code blue"   In the event of cardiac or respiratory ARREST Do not perform Intubation, CPR, defibrillation or ACLS   In the event of cardiac or respiratory ARREST Use medication by any route, position, wound care, and other measures to relive pain and suffering. May use oxygen, suction and manual treatment of airway obstruction as needed for comfort.   Comments nurse may pronounce      01/14/2015 1143           Consults  pulmonary   DVT Prophylaxis  SCDs  Lab Results  Component Value Date   PLT 194 01/18/2015     Time Spent in minutes   45 minutes of critical care time spent  Auburn Bilberry M.D on 01/28/2015 at 11:46 AM  Between 7am to 6pm - Pager - [phone]  After 6pm go to [email or web address] - password [email or web address] [place]  [facility] [place] Hospitalists   Office  [phone]

## 2015-01-28 NOTE — Consult Note (Addendum)
PULMONARY/CCM CONSULT NOTE  Requesting MD/Service: IM Date of admission: 9/23 Date of consult: 9/24 Reason for consultation: VDRF  Pt Profile:  60 F admitted and intubated 9/23 with massive, nonsurvivable ICH     HPI:  Pt was in USOH until sudden onset of severe HA and rapid deterioration in LOC. Intubated in ED for depressed LOC. Pt's family declined transfer to a center for neurosurgical eval and intervention. Pt has been made DNR.   Past Medical History  Diagnosis Date  . Hypertension   . Dementia   . GERD (gastroesophageal reflux disease)   . Back pain     MEDICATIONS: reviewed  Social History   Social History  . Marital Status: Divorced    Spouse Name: N/A  . Number of Children: N/A  . Years of Education: N/A   Occupational History  . Not on file.   Social History Main Topics  . Smoking status: Never Smoker   . Smokeless tobacco: Current User  . Alcohol Use: No  . Drug Use: No  . Sexual Activity: Not on file   Other Topics Concern  . Not on file   Social History Narrative  . No narrative on file    Family History  Problem Relation Age of Onset  . CAD Mother     ROS - N/A  Filed Vitals:   01/28/15 1000 01/28/15 1100 01/28/15 1200 01/28/15 1300  BP: 162/99 151/101 166/101 147/95  Pulse: 96 103 106 91  Temp:  99.6 F (37.6 C)    TempSrc:  Oral    Resp: Weight:      SpO2: 100% 100% 100% 100%    EXAM:  Gen: comatose, she does make spontaneous respirations HEENT: NCAT Neck: No JVD Lungs: Clear Cardiovascular: regular Abdomen: soft, NT Ext: no edema Neuro: no spont movement, minimal posturing with noxious stimulation  DATA:  BMP Latest Ref Rng 01/13/2015  Glucose 65 - 99 mg/dL 161(W)  BUN 6 - 20 mg/dL 14  Creatinine 9.60 - 4.54 mg/dL 0.98  Sodium 119 - 147 mmol/L 143  Potassium 3.5 - 5.1 mmol/L 3.3(L)  Chloride 101 - 111 mmol/L 107  CO2 22 - 32 mmol/L 28  Calcium 8.9 - 10.3 mg/dL 9.0    CBC Latest Ref Rng 01/30/2015   WBC 3.6 - 11.0 K/uL 5.2  Hemoglobin 12.0 - 16.0 g/dL 82.9  Hematocrit 56.2 - 47.0 % 42.5  Platelets 150 - 440 K/uL 194    CXR: NACPD  CT head: massive SAH with mild midline shift  IMPRESSION:   Subarachnoid hemorrhage - likely hypertensive Cerebral edema Coma  This is not survivable  PLAN:  Cont current vent settings I have spoken with family We are awaiting the arrival of pt's other daughter to discuss options. I will recommend discontinuation of vent support.  Agree with DNR status Would not check any further labs or Xrays as they will not alter outcome  Billy Fischer, MD PCCM service Mobile [phone] Pager [phone]     ADD: family plans to discontinue vent support tomorrow. They wish that we ensure that Ms Guimaraes not suffer in any way until then. I have started a morphine gtt  Billy Fischer, MD PCCM service Mobile [phone] Pager [phone]

## 2015-01-28 NOTE — Progress Notes (Signed)
Called [facility] concerning pt GCS of 3.  According to [facility] pt not suitable for donations.  Ref number of [id number]

## 2015-01-28 NOTE — Progress Notes (Signed)
[facility] - [place] at [facility]                                                                                                                                                                                            Patient Demographics   Abigail Rasmussen, is a 81 y.o. female, DOB - [birth year], ZOX:[id number]  Admit date - 01/26/2015   Admitting Physician Alford Highland, MD  Outpatient Primary MD for the patient is No primary care Marcele Kosta on file.   LOS - 1  Subjective:PT REMAINS ON VENT, FAMILY WANTED REAVULATED AND DISCUSS CASE     Review of Systems:   REMAINS ON VENT  Vitals:   Filed Vitals:   01/28/15 1000 01/28/15 1100 01/28/15 1200 01/28/15 1300  BP: 162/99 151/101 166/101 147/95  Pulse: 96 103 106 91  Temp:  99.6 F (37.6 C)    TempSrc:  Oral    Resp: 20 20 24 20   Weight:      SpO2: 100% 100% 100% 100%    Wt Readings from Last 3 Encounters:  01/30/2015 77.111 kg (170 lb)     Intake/Output Summary (Last 24 hours) at 01/28/15 1442 Last data filed at 01/28/15 1100  Gross per 24 hour  Intake   36.8 ml  Output   1325 ml  Net -1288.2 ml    Physical Exam:   GENERAL: CRTICIALLY ILL APPEARING HEAD, EYES, EARS, NOSE AND THROAT: Atraumatic, normocephalic. Extraocular muscles are intact. Pupils equal and reactive to light. Sclerae anicteric. No conjunctival injection. No oro-pharyngeal erythema.  NECK: Supple. There is no jugular venous distention. No bruits, no lymphadenopathy, no thyromegaly.  HEART: Regular rate and rhythm,. No murmurs, no rubs, no clicks.  LUNGS: Clear to auscultation bilaterally. No rales or rhonchi. No wheezes.  ABDOMEN: Soft, flat, nontender, nondistended. Has good bowel sounds. No hepatosplenomegaly appreciated.  EXTREMITIES: No evidence of any cyanosis, clubbing, or peripheral edema.  +2 pedal and radial pulses bilaterally.  NEUROLOGIC: The patient is alert, awake, and oriented x3 with no focal motor or sensory  deficits appreciated bilaterally.  SKIN: Moist and warm with no rashes appreciated.  Psych: Not anxious, depressed LN: No inguinal LN enlargement    Antibiotics   Anti-infectives    None      Medications   Scheduled Meds: . nitroGLYCERIN  1 inch Topical 4 times per day  . pantoprazole sodium  40 mg Per Tube Q1200  . sodium chloride  3 mL Intravenous Q12H   Continuous Infusions:  PRN Meds:.acetaminophen, hydrALAZINE, LORazepam, morphine injection   Data Review:   Micro Results Recent Results (from the past  240 hour(s))  MRSA PCR Screening     Status: None   Collection Time: 01/09/2015  1:09 PM  Result Value Ref Range Status   MRSA by PCR NEGATIVE NEGATIVE Final    Comment:        The GeneXpert MRSA Assay (FDA approved for NASAL specimens only), is one component of a comprehensive MRSA colonization surveillance program. It is not intended to diagnose MRSA infection nor to guide or monitor treatment for MRSA infections.     Radiology Reports Ct Head Wo Contrast  01/28/2015   CLINICAL DATA:  Intra cerebral hemorrhage.  EXAM: CT HEAD WITHOUT CONTRAST  TECHNIQUE: Contiguous axial images were obtained from the base of the skull through the vertex without intravenous contrast.  COMPARISON:  01/12/2015  FINDINGS: Large volume intraventricular hemorrhage is again seen and demonstrates some interval redistribution, now involving the bodies of the ventricles to a lesser extent with shift more into the temporal and occipital horns, particularly on the right. Dilatation of the right temporal horn has mildly increased. There is only minimal residual hemorrhage in the third and fourth ventricles. Subarachnoid hemorrhage is again seen along the interhemispheric fissure.  Extensive subarachnoid hemorrhage is again seen throughout the basilar cisterns, left greater than right sylvian fissures, left greater than right cerebral sulci, and in the posterior fossa. The overall volume of  subarachnoid hemorrhage appears slightly greater than on the prior CT, although some of the changes reflect redistribution. Diffuse periventricular low density about the lateral ventricles likely reflects a component of transependymal CSF flow.  There is 7 mm of rightward midline shift at the level of the lateral ventricle bodies, slightly less than on the prior study given movement of hemorrhage out of the body of the left lateral ventricle. No definite parenchymal hemorrhage is identified.  Prior bilateral cataract extraction is noted. Paranasal sinuses and mastoid air cells are clear.  IMPRESSION: Extensive intraventricular and subarachnoid hemorrhage with some redistribution and possibly slightly increased volume compared to the prior study. Mild midline shift.   Electronically Signed   By: Sebastian Ache M.D.   On: 01/28/2015 12:42   Ct Head Wo Contrast  01/05/2015   CLINICAL DATA:  Pt with normal morning per nurse and then went unresponsive at 8:55 am.  EXAM: CT HEAD WITHOUT CONTRAST  TECHNIQUE: Contiguous axial images were obtained from the base of the skull through the vertex without intravenous contrast.  COMPARISON:  Brain MRI, 03/31/2013.  FINDINGS: There is extensive acute intracranial hemorrhage. Subarachnoid hemorrhage surrounds the brainstem from the pons through the midbrain, filling the basilar cisterns. It extends into the suprasellar cistern and throughout the left sylvian fissure with lesser subarachnoid hemorrhage noted along the interhemispheric fissure. Hemorrhage extends into the sulci of the left temporal, posterior frontal and parietal lobes. There is also prominent intraventricular hemorrhage with hemorrhage mostly filling the left lateral ventricle, partly filling the right lateral ventricle, filling the third ventricle and mostly filling the fourth ventricle. There is a small amount of subdural hemorrhage along the anterior interhemispheric fissure and the tentorium.  The ventricles are  enlarged, to a greater degree than they were on the prior brain MRI, and there is relative sulcal effacement. These findings are consistent with hydrocephalus. There is mild midline shift to the right of 4 mm.  There is hypoattenuation throughout the white matter of the left cerebral hemisphere with milder rope right-sided white matter hypoattenuation. Findings are consistent with a combination of vasogenic edema and underlying chronic microvascular ischemic change. No  convincing cortical infarct.  The visualized sinuses and mastoid air cells are clear. No skull lesion.  IMPRESSION: 1. Extensive intracranial hemorrhage which is predominantly subarachnoid and intraventricular hemorrhage. 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 2. There is ventricular enlargement consistent with hydrocephalus. 3. There is vasogenic edema, more on the left than the right. Mild midline shift to the right noted of 4 mm. 4. No evidence of an ischemic infarct. These results were called by telephone at the time of interpretation on 01/26/2015 at 10:19 am to Dr. Minna Antis , who verbally acknowledged these results.   Electronically Signed   By: Amie Portland M.D.   On: 01/20/2015 10:20   Dg Chest Portable 1 View  01/16/2015   CLINICAL DATA:  Endotracheal tube placement.  EXAM: PORTABLE CHEST 1 VIEW  COMPARISON:  None.  FINDINGS: Endotracheal tube is seen projected over the tracheal air shadow with distal tip approximately 5 cm above the carina. No pneumothorax or pleural effusion is noted. There is noted tortuosity and enlargement of the descending thoracic aorta concerning for possible thoracic aortic aneurysm. No acute pulmonary disease is noted. Bony thorax is intact.  IMPRESSION: Endotracheal tube in grossly good position. Tortuosity and enlargement of descending thoracic aorta is noted concerning for  possible thoracic aortic aneurysm. CTA of the chest is recommended for further evaluation.   Electronically Signed   By: Lupita Raider, M.D.   On: 01/15/2015 12:20     CBC  Recent Labs Lab 02/03/2015 0943  WBC 5.2  HGB 13.9  HCT 42.5  PLT 194  MCV 95.2  MCH 31.1  MCHC 32.7  RDW 14.5  LYMPHSABS 2.2  MONOABS 0.3  EOSABS 0.1  BASOSABS 0.0    Chemistries   Recent Labs Lab 01/26/2015 0943  NA 143  K 3.3*  CL 107  CO2 28  GLUCOSE 204*  BUN 14  CREATININE 0.76  CALCIUM 9.0  AST 29  ALT 15  ALKPHOS 73  BILITOT 0.7   ------------------------------------------------------------------------------------------------------------------ CrCl cannot be calculated (Unknown ideal weight.). ------------------------------------------------------------------------------------------------------------------ No results for input(s): HGBA1C in the last 72 hours. ------------------------------------------------------------------------------------------------------------------ No results for input(s): CHOL, HDL, LDLCALC, TRIG, CHOLHDL, LDLDIRECT in the last 72 hours. ------------------------------------------------------------------------------------------------------------------ No results for input(s): TSH, T4TOTAL, T3FREE, THYROIDAB in the last 72 hours.  Invalid input(s): FREET3 ------------------------------------------------------------------------------------------------------------------ No results for input(s): VITAMINB12, FOLATE, FERRITIN, TIBC, IRON, RETICCTPCT in the last 72 hours.  Coagulation profile  Recent Labs Lab 01/12/2015 0943  INR 0.89    No results for input(s): DDIMER in the last 72 hours.  Cardiac Enzymes  Recent Labs Lab 01/30/2015 0943  TROPONINI <0.03   ------------------------------------------------------------------------------------------------------------------ Invalid input(s): POCBNP    Assessment & Plan   Active Problems:   Cerebral brain  hemorrhage EXTENSIVE D/W FAMILY REGARDING PROGRNOSIS MULTIPLE QUESTIONS ANSWERED, FAMILY TO DECIDE ON CODE AND FUTHER TREATEMENT      Code Status Orders        Start     Ordered   01/13/2015 1143  Do not attempt resuscitation (DNR)   Continuous    Question Answer Comment  In the event of cardiac or respiratory ARREST Do not call a "code blue"   In the event of cardiac or respiratory ARREST Do not perform Intubation, CPR, defibrillation or ACLS   In the event of cardiac or respiratory ARREST Use medication by any route, position, wound care, and other measures to  relive pain and suffering. May use oxygen, suction and manual treatment of airway obstruction as needed for comfort.   Comments nurse may pronounce      01/14/2015 1143             Lab Results  Component Value Date   PLT 194 01/26/2015     Time Spent in minutes  CRITICAL CARE TIME  Auburn Bilberry M.D on 01/28/2015 at 2:42 PM  Between 7am to 6pm - Pager - [phone]  After 6pm go to [email or web address] - password [email or web address] [facility]  [facility] [place] [facility]   Office  [phone]

## 2015-01-29 DIAGNOSIS — R40243 Glasgow coma scale score 3-8: Secondary | ICD-10-CM

## 2015-01-29 MED ORDER — SCOPOLAMINE 1 MG/3DAYS TD PT72
1.0000 | MEDICATED_PATCH | TRANSDERMAL | Status: DC
  Administered 2015-01-29: 1.5 mg via TRANSDERMAL
  Filled 2015-01-29: qty 1

## 2015-01-29 MED ORDER — MORPHINE SULFATE (PF) 2 MG/ML IV SOLN: 2.0000 mg | INTRAVENOUS | Status: DC | PRN

## 2015-01-29 MED ORDER — MORPHINE SULFATE (PF) 2 MG/ML IV SOLN: 2.0000 mg | INTRAVENOUS | Status: DC

## 2015-01-29 NOTE — Progress Notes (Signed)
Pt extubated, nitro paste removed. Family now at bedside. Morphine drip continues at . Abigail Rasmussen E 2:52 PM 01/29/2015

## 2015-01-29 NOTE — Progress Notes (Signed)
Extubated per MD order                  MD

## 2015-01-29 NOTE — Progress Notes (Signed)
[facility] [place] at [facility]                                                                                                                                                                                            Patient Demographics   Abigail Rasmussen, is a 81 y.o. female, DOB - 1915-07-21, ZOX:[id number]  Admit date - 02-24-15   Admitting Physician Alford Highland, MD  Outpatient Primary MD for the patient is No primary care provider on file.   LOS - 2  Subjective:  Patient remains on the ventilator poorly responsive not on any sedation multiple family members at the bedside   Review of Systems:  Unable to provide on the ventilator   Vitals:   Filed Vitals:   01/29/15 0700 01/29/15 0800 01/29/15 0900 01/29/15 1000  BP: 108/79 107/75 107/77 109/74  Pulse: 88 90 90 88  Temp: 99.3 F (37.4 C)     TempSrc: Oral     Resp: Weight:      SpO2: 100% 100% 100% 100%    Wt Readings from Last 3 Encounters:  02-24-15 77.111 kg (170 lb)     Intake/Output Summary (Last 24 hours) at 01/29/15 1101 Last data filed at 01/29/15 0600  Gross per 24 hour  Intake  28.23 ml  Output    335 ml  Net -306.77 ml    Physical Exam:   GENERAL: Critically ill-appearing.  HEAD, EYES, EARS, NOSE AND THROAT: Atraumatic, normocephalic. Pupils equal and reactive to light. Sclerae anicteric. No conjunctival injection. No oro-pharyngeal erythema.  NECK: Supple. There is no jugular venous distention. No bruits, no lymphadenopathy, no thyromegaly.  HEART: Regular rate and rhythm,. No murmurs, no rubs, no clicks.  LUNGS: Clear to auscultation bilaterally. No rales or rhonchi. No wheezes. On the ventilator ABDOMEN: Soft, flat, nontender, nondistended. Has good bowel sounds. No hepatosplenomegaly appreciated.  EXTREMITIES: No evidence of any cyanosis, clubbing, or peripheral edema.  +2 pedal and radial pulses bilaterally.  NEUROLOGIC: Not responsive SKIN: Moist  and warm with no rashes appreciated.  Psych: Not responsive LN: No inguinal LN enlargement    Antibiotics   Anti-infectives    None      Medications   Scheduled Meds: . antiseptic oral rinse  7 mL Mouth Rinse 10 times per day  . chlorhexidine gluconate  15 mL Mouth Rinse BID  .  morphine injection  2-4 mg Intravenous Q30 min  . sodium chloride  3 mL Intravenous Q12H   Continuous Infusions: . morphine 2 mg/hr (01/28/15 2000)   PRN Meds:.acetaminophen   Data Review:  Micro Results Recent Results (from the past 240 hour(s))  MRSA PCR Screening     Status: None   Collection Time: Feb 22, 2015  1:09 PM  Result Value Ref Range Status   MRSA by PCR NEGATIVE NEGATIVE Final    Comment:        The GeneXpert MRSA Assay (FDA approved for NASAL specimens only), is one component of a comprehensive MRSA colonization surveillance program. It is not intended to diagnose MRSA infection nor to guide or monitor treatment for MRSA infections.     Radiology Reports Ct Head Wo Contrast  01/28/2015   CLINICAL DATA:  Intra cerebral hemorrhage.  EXAM: CT HEAD WITHOUT CONTRAST  TECHNIQUE: Contiguous axial images were obtained from the base of the skull through the vertex without intravenous contrast.  COMPARISON:  2015-02-22  FINDINGS: Large volume intraventricular hemorrhage is again seen and demonstrates some interval redistribution, now involving the bodies of the ventricles to a lesser extent with shift more into the temporal and occipital horns, particularly on the right. Dilatation of the right temporal horn has mildly increased. There is only minimal residual hemorrhage in the third and fourth ventricles. Subarachnoid hemorrhage is again seen along the interhemispheric fissure.  Extensive subarachnoid hemorrhage is again seen throughout the basilar cisterns, left greater than right sylvian fissures, left greater than right cerebral sulci, and in the posterior fossa. The overall volume of  subarachnoid hemorrhage appears slightly greater than on the prior CT, although some of the changes reflect redistribution. Diffuse periventricular low density about the lateral ventricles likely reflects a component of transependymal CSF flow.  There is 7 mm of rightward midline shift at the level of the lateral ventricle bodies, slightly less than on the prior study given movement of hemorrhage out of the body of the left lateral ventricle. No definite parenchymal hemorrhage is identified.  Prior bilateral cataract extraction is noted. Paranasal sinuses and mastoid air cells are clear.  IMPRESSION: Extensive intraventricular and subarachnoid hemorrhage with some redistribution and possibly slightly increased volume compared to the prior study. Mild midline shift.   Electronically Signed   By: Sebastian Ache M.D.   On: 01/28/2015 12:42   Ct Head Wo Contrast  22-Feb-2015   CLINICAL DATA:  Pt with normal morning per nurse and then went unresponsive at 8:55 am.  EXAM: CT HEAD WITHOUT CONTRAST  TECHNIQUE: Contiguous axial images were obtained from the base of the skull through the vertex without intravenous contrast.  COMPARISON:  Brain MRI, 03/31/2013.  FINDINGS: There is extensive acute intracranial hemorrhage. Subarachnoid hemorrhage surrounds the brainstem from the pons through the midbrain, filling the basilar cisterns. It extends into the suprasellar cistern and throughout the left sylvian fissure with lesser subarachnoid hemorrhage noted along the interhemispheric fissure. Hemorrhage extends into the sulci of the left temporal, posterior frontal and parietal lobes. There is also prominent intraventricular hemorrhage with hemorrhage mostly filling the left lateral ventricle, partly filling the right lateral ventricle, filling the third ventricle and mostly filling the fourth ventricle. There is a small amount of subdural hemorrhage along the anterior interhemispheric fissure and the tentorium.  The ventricles are  enlarged, to a greater degree than they were on the prior brain MRI, and there is relative sulcal effacement. These findings are consistent with hydrocephalus. There is mild midline shift to the right of 4 mm.  There is hypoattenuation throughout the white matter of the left cerebral hemisphere with milder rope right-sided white matter hypoattenuation. Findings are consistent with a combination of vasogenic edema  and underlying chronic microvascular ischemic change. No convincing cortical infarct.  The visualized sinuses and mastoid air cells are clear. No skull lesion.  IMPRESSION: 1. Extensive intracranial hemorrhage which is predominantly subarachnoid and intraventricular hemorrhage. 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 2. There is ventricular enlargement consistent with hydrocephalus. 3. There is vasogenic edema, more on the left than the right. Mild midline shift to the right noted of 4 mm. 4. No evidence of an ischemic infarct. These results were called by telephone at the time of interpretation on 01/07/2015 at 10:19 am to Dr. Minna Antis , who verbally acknowledged these results.   Electronically Signed   By: Amie Portland M.D.   On: 01/29/2015 10:20   Dg Chest Portable 1 View  01/29/2015   CLINICAL DATA:  Endotracheal tube placement.  EXAM: PORTABLE CHEST 1 VIEW  COMPARISON:  None.  FINDINGS: Endotracheal tube is seen projected over the tracheal air shadow with distal tip approximately 5 cm above the carina. No pneumothorax or pleural effusion is noted. There is noted tortuosity and enlargement of the descending thoracic aorta concerning for possible thoracic aortic aneurysm. No acute pulmonary disease is noted. Bony thorax is intact.  IMPRESSION: Endotracheal tube in grossly good position. Tortuosity and enlargement of descending thoracic aorta is noted concerning for  possible thoracic aortic aneurysm. CTA of the chest is recommended for further evaluation.   Electronically Signed   By: Lupita Raider, M.D.   On: 02/02/2015 12:20     CBC  Recent Labs Lab 01/29/2015 0943  WBC 5.2  HGB 13.9  HCT 42.5  PLT 194  MCV 95.2  MCH 31.1  MCHC 32.7  RDW 14.5  LYMPHSABS 2.2  MONOABS 0.3  EOSABS 0.1  BASOSABS 0.0    Chemistries   Recent Labs Lab 01/21/2015 0943  NA 143  K 3.3*  CL 107  CO2 28  GLUCOSE 204*  BUN 14  CREATININE 0.76  CALCIUM 9.0  AST 29  ALT 15  ALKPHOS 73  BILITOT 0.7   ------------------------------------------------------------------------------------------------------------------ CrCl cannot be calculated (Unknown ideal weight.). ------------------------------------------------------------------------------------------------------------------ No results for input(s): HGBA1C in the last 72 hours. ------------------------------------------------------------------------------------------------------------------ No results for input(s): CHOL, HDL, LDLCALC, TRIG, CHOLHDL, LDLDIRECT in the last 72 hours. ------------------------------------------------------------------------------------------------------------------ No results for input(s): TSH, T4TOTAL, T3FREE, THYROIDAB in the last 72 hours.  Invalid input(s): FREET3 ------------------------------------------------------------------------------------------------------------------ No results for input(s): VITAMINB12, FOLATE, FERRITIN, TIBC, IRON, RETICCTPCT in the last 72 hours.  Coagulation profile  Recent Labs Lab 01/30/2015 0943  INR 0.89    No results for input(s): DDIMER in the last 72 hours.  Cardiac Enzymes  Recent Labs Lab 02/02/2015 0943  TROPONINI <0.03   ------------------------------------------------------------------------------------------------------------------ Invalid input(s): POCBNP    Assessment & Plan   1. Extensive intracranial  hemorrhage predominantly subarachnoid. Patient also has vasogenic edema. Family did not want to transfer to a tertiary care center where there is a Midwife. Continue mechanical ventilation awaiting other family members plan is likely for terminal extubation  2. Accelerated hypertension- blood pressure now normal off medications  3. Gastroesophageal reflux disease without esophagitis 4. History of gout 5. History of dementia     Code Status Orders        Start     Ordered   01/07/2015 1143  Do not attempt resuscitation (DNR)   Continuous    Question Answer Comment  In the event of cardiac or  respiratory ARREST Do not call a "code blue"   In the event of cardiac or respiratory ARREST Do not perform Intubation, CPR, defibrillation or ACLS   In the event of cardiac or respiratory ARREST Use medication by any route, position, wound care, and other measures to relive pain and suffering. May use oxygen, suction and manual treatment of airway obstruction as needed for comfort.   Comments nurse may pronounce      02-22-15 1143           Consults  pulmonary   DVT Prophylaxis  SCDs  Lab Results  Component Value Date   PLT 194 2015-02-22     Time Spent in minutes   35 minutes of critical care time spent  Auburn Bilberry M.D on 01/29/2015 at 11:01 AM  Between 7am to 6pm - Pager - [phone]  After 6pm go to [email or web address] - password [email or web address] [facility]  [facility]   Office  [phone]

## 2015-01-29 NOTE — Progress Notes (Signed)
Brief Nutrition Assessment:   RD screened, assessed due to ventilator status. Pt with massive, non-survivable ICH per MD notes; currently on vent, DNR. Pt NPO, no OG/NG. Per MD notes, awaiting further decision from family regarding poc.   Will sign off; please consult RD if nutrition intervention is warranted  Romelle Starcher MS, RD, LDN [phone] Pager

## 2015-01-29 NOTE — Progress Notes (Signed)
PULMONARY/CCM CONSULT NOTE  Requesting MD/Service: IM Date of admission: 9/23 Date of consult: 9/24 Reason for consultation: VDRF  Pt Profile:  76 F admitted and intubated 9/23 with massive, nonsurvivable ICH     HPI:  Pt was in USOH until sudden onset of severe HA and rapid deterioration in LOC. Intubated in ED for depressed LOC. Pt's family declined transfer to a center for neurosurgical eval and intervention. Pt has been made DNR.   Past Medical History  Diagnosis Date  . Hypertension   . Dementia   . GERD (gastroesophageal reflux disease)   . Back pain     MEDICATIONS: reviewed  Social History   Social History  . Marital Status: Divorced    Spouse Name: N/A  . Number of Children: N/A  . Years of Education: N/A   Occupational History  . Not on file.   Social History Main Topics  . Smoking status: Never Smoker   . Smokeless tobacco: Current User  . Alcohol Use: No  . Drug Use: No  . Sexual Activity: Not on file   Other Topics Concern  . Not on file   Social History Narrative  . No narrative on file    Family History  Problem Relation Age of Onset  . CAD Mother     ROS - N/A  Filed Vitals:   01/29/15 0600 01/29/15 0700 01/29/15 0800 01/29/15 0900  BP: 111/74 108/79 107/75 107/77  Pulse: 90 88 90 90  Temp:  99.3 F (37.4 C)    TempSrc:  Oral    Resp: Weight:      SpO2: 100% 100% 100% 100%    EXAM:  Gen: comatose, she does make spontaneous respirations HEENT: NCAT Neck: No JVD Lungs: Clear Cardiovascular: regular Abdomen: soft, NT Ext: no edema Neuro: no spont movement, minimal posturing with noxious stimulation  DATA:  BMP Latest Ref Rng 02/02/2015  Glucose 65 - 99 mg/dL 811(B)  BUN 6 - 20 mg/dL 14  Creatinine 1.47 - 8.29 mg/dL 5.62  Sodium 130 - 865 mmol/L 143  Potassium 3.5 - 5.1 mmol/L 3.3(L)  Chloride 101 - 111 mmol/L 107  CO2 22 - 32 mmol/L 28  Calcium 8.9 - 10.3 mg/dL 9.0    CBC Latest Ref Rng 01/08/2015  WBC  3.6 - 11.0 K/uL 5.2  Hemoglobin 12.0 - 16.0 g/dL 78.4  Hematocrit 69.6 - 47.0 % 42.5  Platelets 150 - 440 K/uL 194    CXR: NACPD  CT head: massive SAH with mild midline shift  IMPRESSION:   Massive subarachnoid hemorrhage - likely hypertensive Cerebral edema Coma   PLAN:  Plan for terminal extubation later this morning after family arrives and have had time to spend with her I have spoken to a grand daughter and close friend describing the process of terminal extubation  I have made myself available when rest of family arrives  Billy Fischer, MD PCCM service Mobile [phone] Pager [phone]

## 2015-01-29 NOTE — Progress Notes (Signed)
Patient transferred from CCU - comfort care measures only. Large family at bedside. Morphine drip infusing. Bo Mcclintock, RN

## 2015-01-30 LAB — GLUCOSE, CAPILLARY: Glucose-Capillary: 208 mg/dL — ABNORMAL HIGH (ref 65–99)

## 2015-01-30 MED ORDER — PROCHLORPERAZINE 25 MG RE SUPP: 25.0000 mg | Freq: Two times a day (BID) | RECTAL | Status: AC | PRN

## 2015-01-30 MED ORDER — PROCHLORPERAZINE 25 MG RE SUPP
25.0000 mg | Freq: Two times a day (BID) | RECTAL | Status: DC | PRN
  Filled 2015-01-30: qty 1

## 2015-01-30 MED ORDER — MORPHINE SULFATE (CONCENTRATE) 10 MG /0.5 ML PO SOLN: 5.0000 mg | ORAL | Status: AC | PRN

## 2015-01-30 MED ORDER — SCOPOLAMINE 1 MG/3DAYS TD PT72: 1.0000 | MEDICATED_PATCH | TRANSDERMAL | Status: AC

## 2015-01-30 MED ORDER — LORAZEPAM 2 MG/ML IJ SOLN: 0.5000 mg | Freq: Four times a day (QID) | INTRAMUSCULAR | Status: DC | PRN

## 2015-01-30 MED ORDER — MORPHINE SULFATE (PF) 2 MG/ML IV SOLN
2.0000 mg | INTRAVENOUS | Status: DC | PRN
  Administered 2015-01-30 – 2015-01-31 (×4): 2 mg via INTRAVENOUS
  Filled 2015-01-30 (×5): qty 1

## 2015-01-30 MED ORDER — ACETAMINOPHEN 650 MG RE SUPP: 650.0000 mg | Freq: Four times a day (QID) | RECTAL | Status: AC | PRN

## 2015-01-30 MED ORDER — BISACODYL 10 MG RE SUPP: 10.0000 mg | Freq: Every day | RECTAL | Status: DC | PRN

## 2015-01-30 MED ORDER — BISACODYL 10 MG RE SUPP: 10.0000 mg | Freq: Every day | RECTAL | Status: AC | PRN

## 2015-01-30 MED ORDER — LORAZEPAM 0.5 MG PO TABS: 0.5000 mg | ORAL_TABLET | ORAL | Status: AC | PRN

## 2015-01-30 NOTE — Progress Notes (Addendum)
Palliative Care Update  Pt is seen and examined.  About 20 family members are in the room.  Her two daughters are both present (the one who lives here and also the one who flew in from [place] late Friday night).  Pt had a massive brain bleed Friday. Shye was terminally extubated and not expected to survive for very long. She is still with [place] but still quite unconscious and is not expected to survive.    After a while, I was able to get their attention and I was able to discuss options at this point. Pt seems relatively stable in terms of vital signs.  She has had a lot of trouble with secretions but a scopolamine patch was placed yesterday and this is helping so far. She has ronchi and rattles in her respirations but her vital signs are fairly stable (with SBP running around 100-105 ).  I have talked with everyone in the room along with the two daughters. They asked each person his or her opinion about pt possibly going to Hospice Home.  They understand that the hospital is an acute care setting and that the experts in comfort care are at [facility]. I did explore the possibility of pt going home with family as an option, but they stated they could not handle that.  They are all in agreement at this time to have a consult from the Hospice Liaison.  I let them know that if pt becomes unstable prior to going to Hospice Home that we would keep her here instead of transferring her there.  I let them know that it is not a definite plan yet until things are finalized in terms of signing her up to go to [facility].  They want to hear about it and would like her to go if that can be worked out.  Pt is only on Morphine 2 mg/ hr.  Her biggest symptom has been secretion management.  I will change her to a prn liquid morphine. I have discussed all of this with the pts nurse and care mgmt and Hospice Liaison.    Pt is DNR.  Full note to follow.   Suan Halter, MD  Addendum:  I have spoken with  Dr Leotis Shames and she is in agreement that transfer to hospice home would be fine and she is OK with me doing the DC meds in anticipation of pt going tomorrow (it may be too late in the day today to get this accomplished). Pt may take a downturn during the night, so her clinical stability for transfer will be reassessed before she is transferred.

## 2015-01-30 NOTE — Plan of Care (Signed)
Problem: Discharge Progression Outcomes Goal: Other Discharge Outcomes/Goals Plan of care progress to goal:  Patient appears comfortable. Family at bedside. Morphine drip discontinued. Patient to transfer to Hospice tomorrow per Clydie Braun.

## 2015-01-30 NOTE — Plan of Care (Signed)
Problem: Discharge Progression Outcomes Goal: Other Discharge Outcomes/Goals Outcome: Not Applicable Date Met:  68/34/19 Patient remains comfortable, scopolamine patch placed yesterday, much improvement with this, family at bedside and understands process.  Patient appears comfortable remains on morphine drip 2 ml/hr.

## 2015-01-30 NOTE — Progress Notes (Signed)
   01/30/15 1320  Clinical Encounter Type  Visited With Patient and family together  Visit Type Follow-up  Referral From Nurse  Consult/Referral To Chaplain  Spiritual Encounters  Spiritual Needs Grief support  Provided pastoral presence and support to family for patient end of life care.  Pt was nonverbal/non responsive, but family said they continue to be grateful and appreciative of chaplain services.  Family said they wish they could take patient home, but it appears to be wishful thinking.  Family will contact us if needed.  Family said they appreciated my visit.  [employer] [place]-pager [phone]

## 2015-01-30 NOTE — Progress Notes (Signed)
i noted patients primary care provider is Dr. Nira Retort singh, I called dr. Einar Crow he will  signout patient to dr. Thedore Mins if patient still alive in morning.

## 2015-01-30 NOTE — Care Management (Signed)
Admitted to [facility] center with the diagnosis of respiratory failure. Placed in ICU on Ventilator. Extubated and transferred to 1C yesterday, Comfort Care continues. Morphine Drip continues.  Dr. Leotis Shames is primary care physician. Family member is Butler Denmark [phone]).  Dr. Orvan Falconer, representative for palliative care spoke with family on Friday.  Unresponsive. Family at the bedside. Gwenette Greet RN MSN Care management [phone]

## 2015-01-30 NOTE — Care Management Important Message (Signed)
Important Message  Patient Details  Name: Abigail Rasmussen MRN: [id number] Date of Birth: 01-Mar-1916   Medicare Important Message Given:  Yes-second notification given    Olegario Messier A Allmond 01/30/2015, 10:14 AM

## 2015-01-30 NOTE — Progress Notes (Addendum)
New hospice home referral received from [facility] following a family meeting with Dr. Megan Salon . Abigail Rasmussen is a 80 year old woman who was admitted to [facility] on 9/23 for evaluation of unresponsiveness following a head ache. She was found to have an extensive intraventricular and subarachnoid hemorrhage with a mid line shift. She was intubated in the ED for airway protection while family was gathered. She was extubated to comfort care on 9/25. Family met with Dr. Megan Salon, Palliative Medicine physician today and have chosen to transfer patient to the hospice home for end of life care. Patient seen lying in bed, unresponsive, facial droop noted. Scopolamine patch in place behind right ear for management of secretions, IV morphine at 24m per hour for management of pain/dyspnea. Writer met with patient's large extended family in her room, services explained, questions answered. Consents signed by patient's daughter JScherry Ran All family present in agreement with transfer tomorrow 9/27 to the hospice home. Staff RN SOk [facility] Rowanand Dr. CMegan Salonall made aware. Signed DNR in place on patient's chart. Writer to call report and arrange EMS transport on 9/27. Information faxed to referral intake. Thank you for the opportunity to participate in the care of this patient. KFlo ShanksRN, BSN, [facility] [place] [facility] Liaison [phone]

## 2015-01-30 NOTE — Progress Notes (Signed)
Subjective: Patient admitted with extensive subarachnoid hemorrhage currently on comfort care.   She is resting comfortably. Family members at bedside.  Objective: Vital signs in last 24 hours:   Weight change:     Intake/Output from previous day: 09/25 0701 - 09/26 0700 In: 70.3 [I.V.:70.3] Out: 75 [Urine:75] Intake/Output this shift:   General:  Nonverbal, non-communicative,  Not in acute distress. HENT: no JVD, no pallor, no icterus Chest: clear to auscultation CVS: RRR, S1S2, no tachycardia Abdomen: soft, non-tender. Ext: no lower leg edema.  Neuro: responds to noxious stimuli   Lab Results: No results for input(s): WBC, HGB, HCT, PLT in the last 72 hours. BMET No results for input(s): NA, K, CL, CO2, GLUCOSE, BUN, CREATININE, CALCIUM in the last 72 hours.  Studies/Results: No results found.  Medications: Scheduled Meds: . scopolamine  1 patch Transdermal Q3 days  . sodium chloride  3 mL Intravenous Q12H   Continuous Infusions: . morphine 2 mg/hr (01/30/15 0240)   PRN Meds:.acetaminophen, morphine injection   Assessment/Plan:  81 year old lady admitted with extensive subarachnoid hemorrhage, initially intubated, subsequently extubated on Sunday.  Patient is currently on comfort care.  Family members at bedside.   LOS: 3 days   Singh,Jasmine 01/30/2015, 1:44 PM

## 2015-01-30 NOTE — Care Management (Signed)
Dr. Orvan Falconer discussed Hospice Home with family members. Family is in agreement. Dayna Barker, RN representative for [facility] updated. Will discuss services with family. Possible discharge to Hospice Home today or tomorrow. Gwenette Greet RN MSN Care management [phone]

## 2015-02-04 NOTE — Plan of Care (Signed)
Problem: Discharge Progression Outcomes Goal: Other Discharge Outcomes/Goals Outcome: Progressing Pt deceased as of 0310.  Annice Pih Page RN confirmed with me, Molli Posey RN time of death.  Present were 2 daughters and a granddaughter. Pt went peacefully.

## 2015-02-04 NOTE — Progress Notes (Signed)
Palliative Medicine Inpatient Consult Follow Up Note   Name: Abigail Rasmussen Date: 2015-02-24 MRN: [id number]  DOB: 10-21-1915  Referring Physician: No att. providers found  Palliative Care consult requested for this 81 y.o. female for goals of medical therapy in patient with an acute severe hemorrhagic stroke felt to be something pt cannot survive.   IMPRESSION: 1. Acute extensive intracranial hemorrhage ---likely due to ruptured aneurysm ---not survivable ---not to be transferred out due to grim prognosis even with neurosurgical intervention 2. Descending aortic aneurysm 3. Essential HTN 4. Degenerative Disc Disease with back pain 5. GERD 6. Dementia --she still was independent with ALL ADLS and cooked full meals at home!!  7. Smokeless tobacco use 8. Intubated and ventilated status due to protection of airway needs given catastrophic stroke 9. Hypokalemia   TODAY'S DISCUSSIONS AND PLANS: I met with 20 - 30 family members all gathered in the room.  The patient had survived the weekend and was noted to have normal vital signs and stable condition, despite a terrible looking brain CT following her massive stroke. Family had mentioned to staff that they would like to take her home and I addressed the option of Hospice in the home.  Family then stated they could not handle that. The two daughters were present and the main decision makers. They were then told about Hospice Home as an option, although since it was late in the day, we might need to see how it goes overnight and then discuss this possibility.  Family had a favorable view of Hospice Home and came to me to request to talk with Hospice Liaison nurse about this option and I passed this request along to case mgmt and to Flo Shanks, RN.   Pt continues with DNR status. I adjusted some of her symptom control med orders and did DC orders for meds in the event she does go to [facility].    REVIEW OF SYSTEMS:  Patient is not  able to provide ROS due to massive stroke  CODE STATUS: DNR   PAST MEDICAL HISTORY: Past Medical History  Diagnosis Date  . Hypertension   . Dementia   . GERD (gastroesophageal reflux disease)   . Back pain     PAST SURGICAL HISTORY:  Past Surgical History  Procedure Laterality Date  . Abdominal hysterectomy    . Breast cyst excision    . Replacement total knee Bilateral     Vital Signs: BP 108/76 mmHg  Pulse 88  Temp(Src) 99.3 F (37.4 C) (Oral)  Resp 18  Wt 77.111 kg (170 lb)  SpO2 100% Filed Weights   01/25/2015 0945  Weight: 77.111 kg (170 lb)    There is no height on file to calculate BMI.  PHYSICAL EXAM: Unresponsive but breathing very evenly with normal vital signs She has had some apnea in the morning but that stopped. EOMI --facial drooping Nares patent Lips dry Neck w/o JVD or TM hrt rrr no m Lungs cta abd soft and nontender Skin warm and dry w/o mottling noted    LABS: CBC:    Component Value Date/Time   WBC 5.2 01/08/2015 0943   HGB 13.9 01/08/2015 0943   HCT 42.5 01/16/2015 0943   PLT 194 01/25/2015 0943   MCV 95.2 01/18/2015 0943   NEUTROABS 2.6 01/16/2015 0943   LYMPHSABS 2.2 01/11/2015 0943   MONOABS 0.3 01/20/2015 0943   EOSABS 0.1 01/17/2015 0943   BASOSABS 0.0 01/11/2015 0943   Comprehensive Metabolic Panel:  Component Value Date/Time   NA 143 01/30/2015 0943   K 3.3* 01/13/2015 0943   CL 107 01/30/2015 0943   CO2 28 01/17/2015 0943   BUN 14 01/08/2015 0943   CREATININE 0.76 01/08/2015 0943   GLUCOSE 204* 01/29/2015 0943   CALCIUM 9.0 01/17/2015 0943   AST 29 01/14/2015 0943   ALT 15 01/21/2015 0943   ALKPHOS 73 01/13/2015 0943   BILITOT 0.7 01/23/2015 0943   PROT 6.5 01/11/2015 0943   ALBUMIN 3.8 01/08/2015 0943      REFERRALS TO BE ORDERED:  Hospice for Hospice Home  More than 50% of the visit was spent in counseling/coordination of care: YES  Time Spent: 35 min

## 2015-02-04 NOTE — Progress Notes (Signed)
   Feb 28, 2015 0330  Clinical Encounter Type  Visited With Family  Visit Type Death  Referral From Nurse  Consult/Referral To Chaplain  Spiritual Encounters  Spiritual Needs Emotional;Grief support  Stress Factors  Family Stress Factors Major life changes  Chaplain provided grief and emotional support to the family.   [employer] [place] [phone]

## 2015-02-04 DEATH — deceased

## 2015-03-07 NOTE — Discharge Summary (Addendum)
Physician Discharge Summary  Patient ID: Rulon SeraJudy T Modica MRN: [id number] DOB/AGE: 79-Jul-1917 79 y.o.  Admit date: 01/11/2015 Discharge date: 10-21-2014  Admission Diagnoses: Extensive subarachnoid hemorrhage.  Discharge Diagnoses:  Active Problems:   Cerebral brain hemorrhage [facility](HCC)   Discharged Condition: died.  Hospital Course: 81 year old lady admitted with extensive subarachnoid hemorrhage, initially intubated, subsequently extubated on Sunday. Patient was placed on comfort care. Family members remained at bedside. Patient died peacefully.   Consults: Palliative Care.  Discharge Exam: Blood pressure 108/76, pulse 88, temperature 99.3 F (37.4 C), temperature source Oral, resp. rate 18, weight 77.111 kg (170 lb), SpO2 100 %. Bilateral pupils fixed and dilated.   Disposition: 20-Expired     Medication List    STOP taking these medications        acetaminophen 650 MG CR tablet  Commonly known as:  TYLENOL  Replaced by:  acetaminophen 650 MG suppository     allopurinol 100 MG tablet  Commonly known as:  ZYLOPRIM     CALTRATE 600+D 600-800 MG-UNIT Tabs  Generic drug:  Calcium Carb-Cholecalciferol     furosemide 20 MG tablet  Commonly known as:  LASIX     memantine 5 MG tablet  Commonly known as:  NAMENDA     metoprolol succinate 50 MG 24 hr tablet  Commonly known as:  TOPROL-XL     omeprazole 20 MG capsule  Commonly known as:  PRILOSEC     sucralfate 1 G tablet  Commonly known as:  CARAFATE     traMADol 50 MG tablet  Commonly known as:  ULTRAM      TAKE these medications        acetaminophen 650 MG suppository  Commonly known as:  TYLENOL  Place 1 suppository (650 mg total) rectally every 6 (six) hours as needed for fever or mild pain.     bisacodyl 10 MG suppository  Commonly known as:  DULCOLAX  Place 1 suppository (10 mg total) rectally daily as needed for moderate constipation.     LORazepam 0.5 MG tablet  Commonly known as:  ATIVAN  Take 1  tablet (0.5 mg total) by mouth every 4 (four) hours as needed for anxiety.     morphine CONCENTRATE 10 mg / 0.5 ml concentrated solution  Take 0.25 mLs (5 mg total) by mouth every 3 (three) hours as needed (pain or shortness of breath).     prochlorperazine 25 MG suppository  Commonly known as:  COMPAZINE  Place 1 suppository (25 mg total) rectally every 12 (twelve) hours as needed for nausea or vomiting.     scopolamine 1 MG/3DAYS  Commonly known as:  TRANSDERM-SCOP  Place 1 patch (1.5 mg total) onto the skin every 3 (three) days.         Signed: Singh,Jasmine 02/16/2015, 12:38 PM

## 2017-09-22 IMAGING — CT CT HEAD W/O CM
2 series · 15 of 30 positions shown, 19 images · non-contrast
Comparison: 01/27/2015

CLINICAL DATA: Intra cerebral hemorrhage.

EXAM:
CT HEAD WITHOUT CONTRAST
TECHNIQUE: Contiguous axial images were obtained from the base of the skull
through the vertex without intravenous contrast.

[Series 2: soft tissue · axial · 0.46mm/px · z∈[-59,+86]mm · 13 of 35 slices shown, 17 images]
[im 3/35  brain]
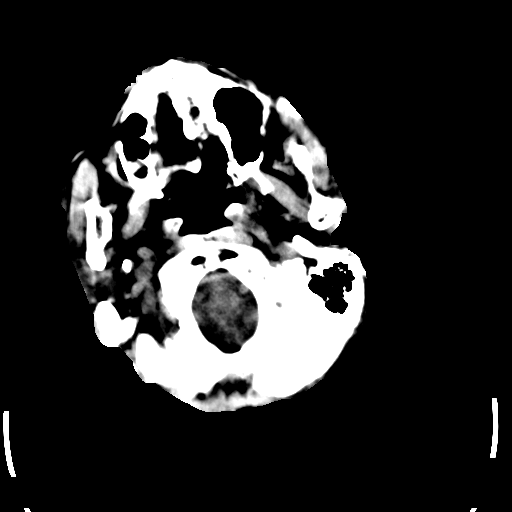
[im 3/35  bone]
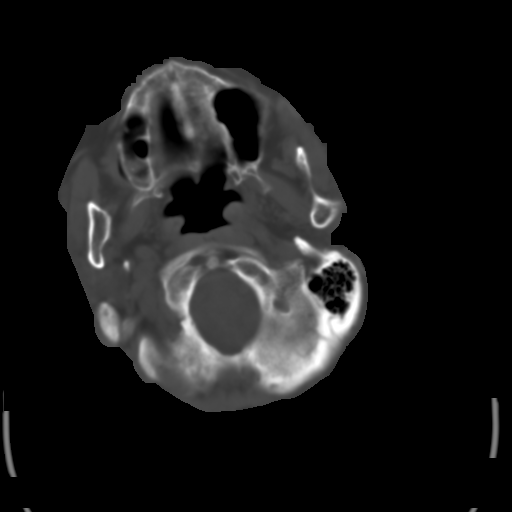
[im 5/35  brain]
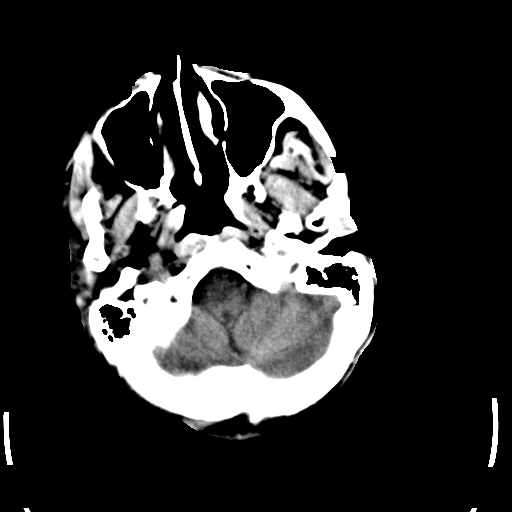
[im 8/35  brain]
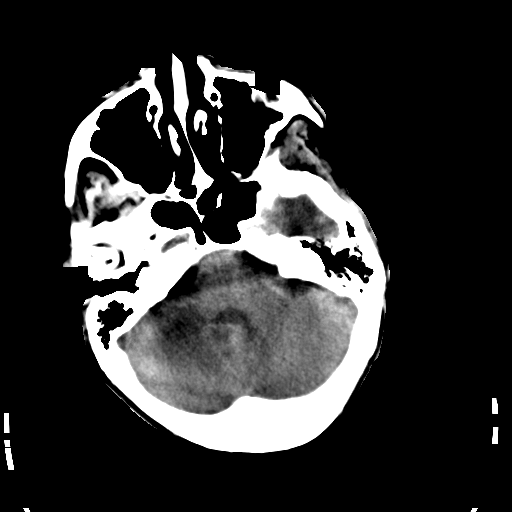
[im 10/35  brain]
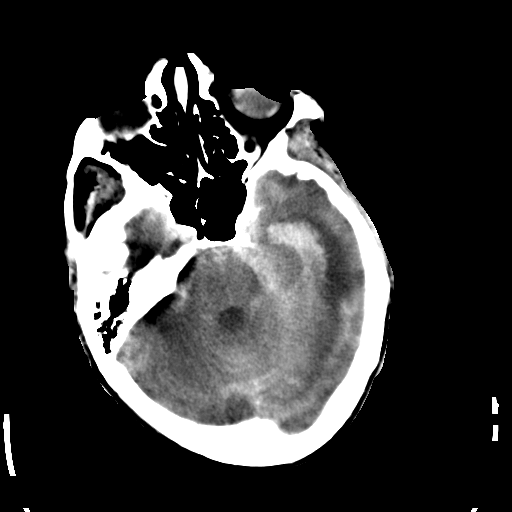
[im 13/35  brain]
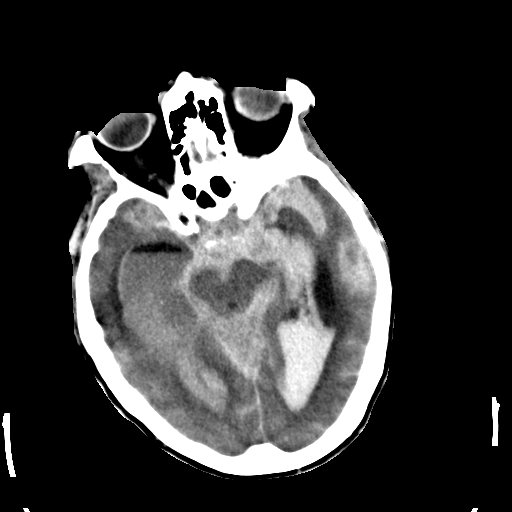
[im 13/35  bone]
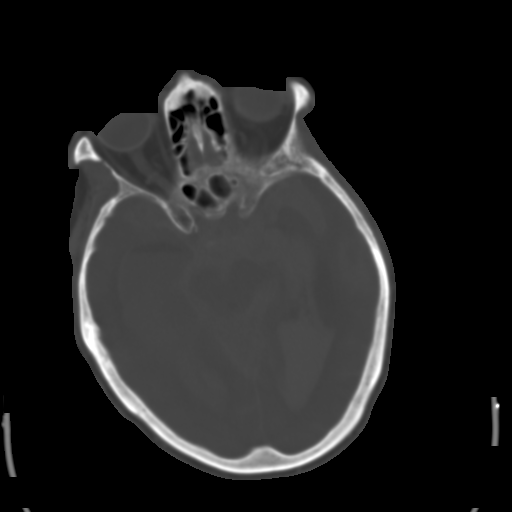
[im 15/35  brain]
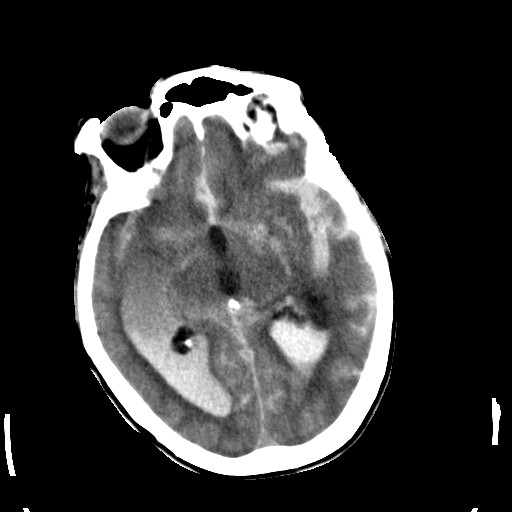
[im 18/35  brain]
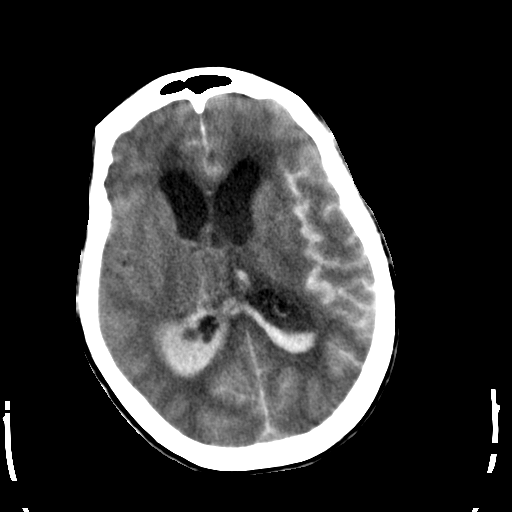
[im 20/35  brain]
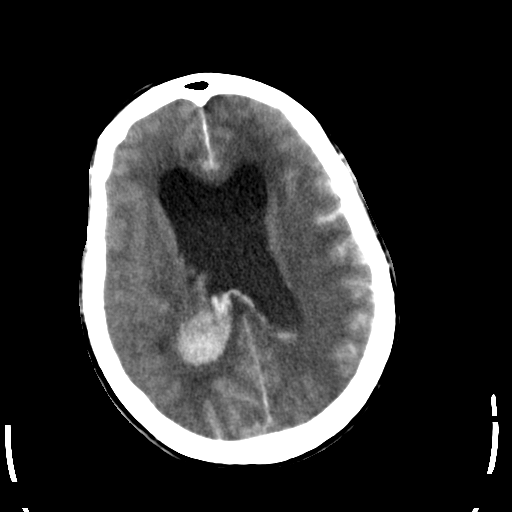
[im 22/35  brain]
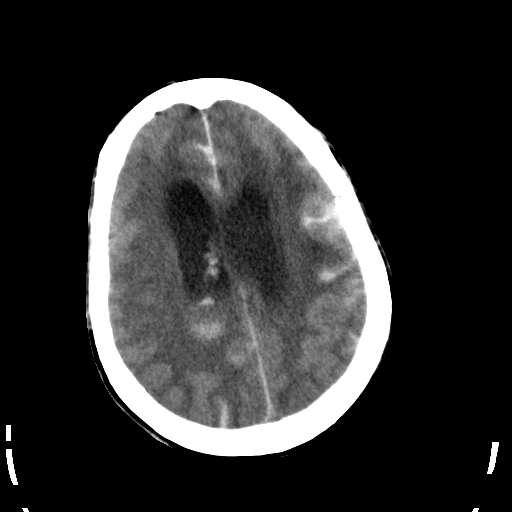
[im 22/35  bone]
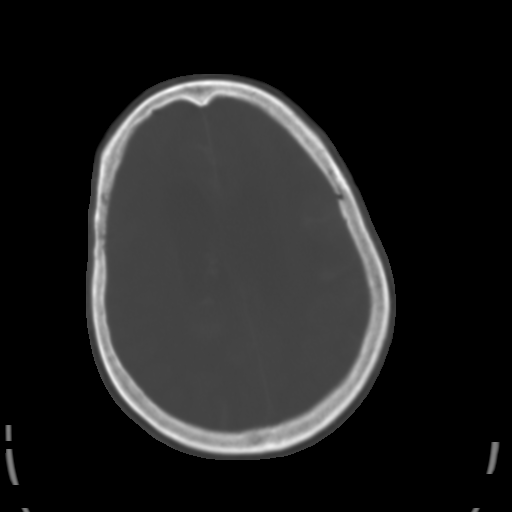
[im 25/35  brain]
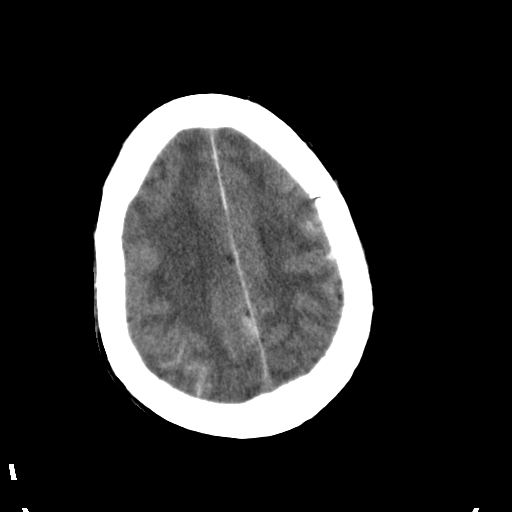
[im 27/35  brain]
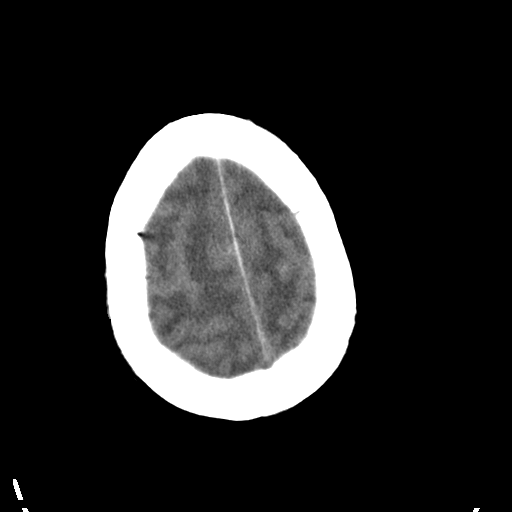
[im 30/35  brain]
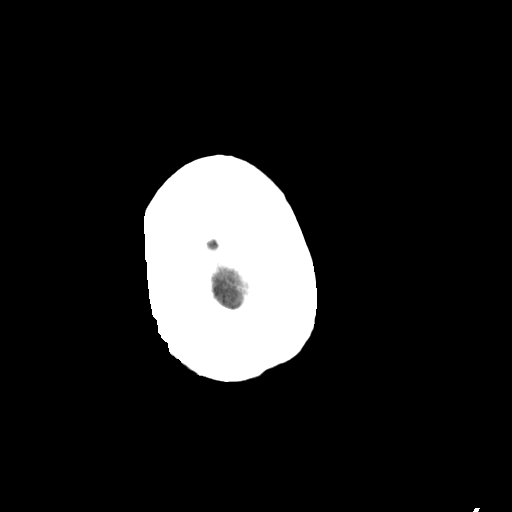
[im 32/35  brain]
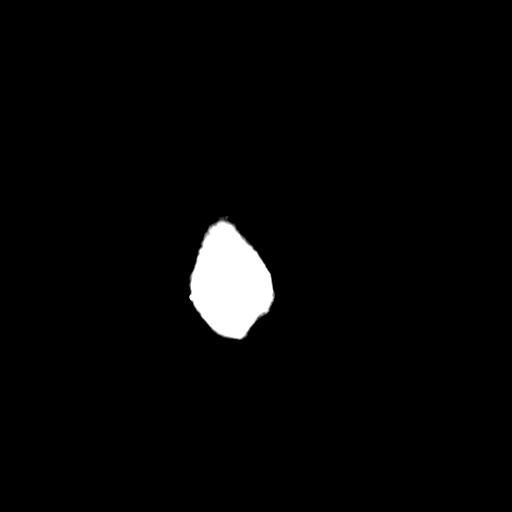
[im 32/35  bone]
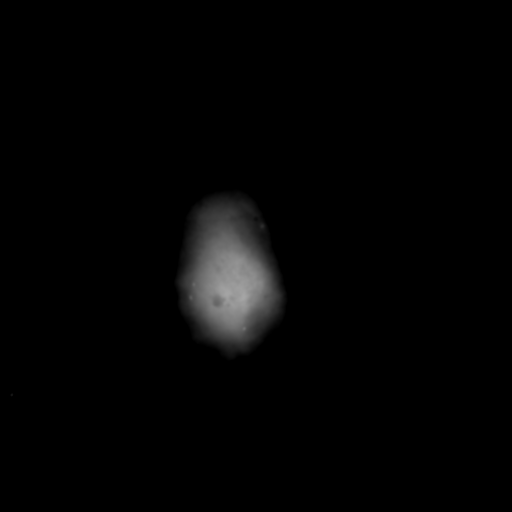

[Series 4: soft tissue recon · axial · 0.38mm/px · z∈[-3,+20]mm · 2 of 35 slices shown]
[im 3/35  brain]
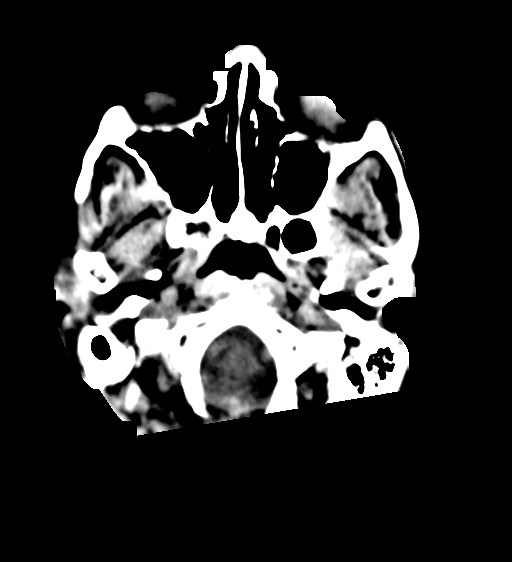
[im 8/35  brain]
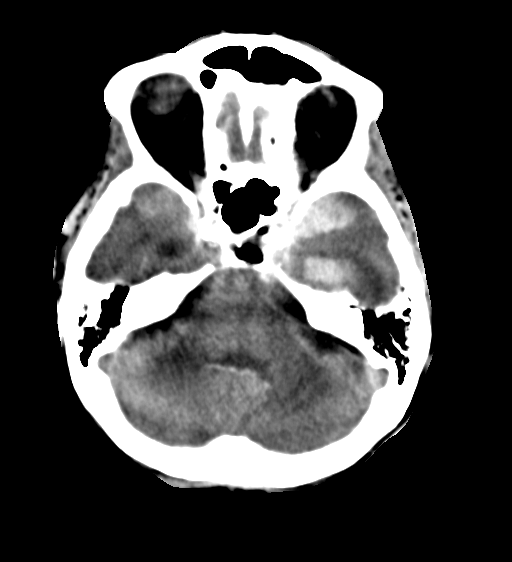

[15 of 30 positions shown; findings below may reference images not displayed]

FINDINGS: Large volume intraventricular hemorrhage is again seen and
demonstrates some interval redistribution, now involving the bodies
of the ventricles to a lesser extent with shift more into the
temporal and occipital horns, particularly on the right. Dilatation
of the right temporal horn has mildly increased. There is only
minimal residual hemorrhage in the third and fourth ventricles.
Subarachnoid hemorrhage is again seen along the interhemispheric
fissure.

Extensive subarachnoid hemorrhage is again seen throughout the
basilar cisterns, left greater than right sylvian fissures, left
greater than right cerebral sulci, and in the posterior fossa. The
overall volume of subarachnoid hemorrhage appears slightly greater
than on the prior CT, although some of the changes reflect
redistribution. Diffuse periventricular low density about the
lateral ventricles likely reflects a component of transependymal CSF
flow.

There is 7 mm of rightward midline shift at the level of the lateral
ventricle bodies, slightly less than on the prior study given
movement of hemorrhage out of the body of the left lateral
ventricle. No definite parenchymal hemorrhage is identified.

Prior bilateral cataract extraction is noted. Paranasal sinuses and
mastoid air cells are clear.
IMPRESSION: Extensive intraventricular and subarachnoid hemorrhage with some
redistribution and possibly slightly increased volume compared to
the prior study. Mild midline shift.
